# Patient Record
Sex: Male | Born: 1935 | Race: White | Hispanic: No | Marital: Married
Health system: Midwestern US, Academic
[De-identification: ages and names within clinical notes are randomized; demographics above are authoritative.]

---

## 2017-05-16 IMAGING — CR CHEST
1 series · 1 of 1 positions shown · non-contrast
Comparison: none

[chest port x-wise]
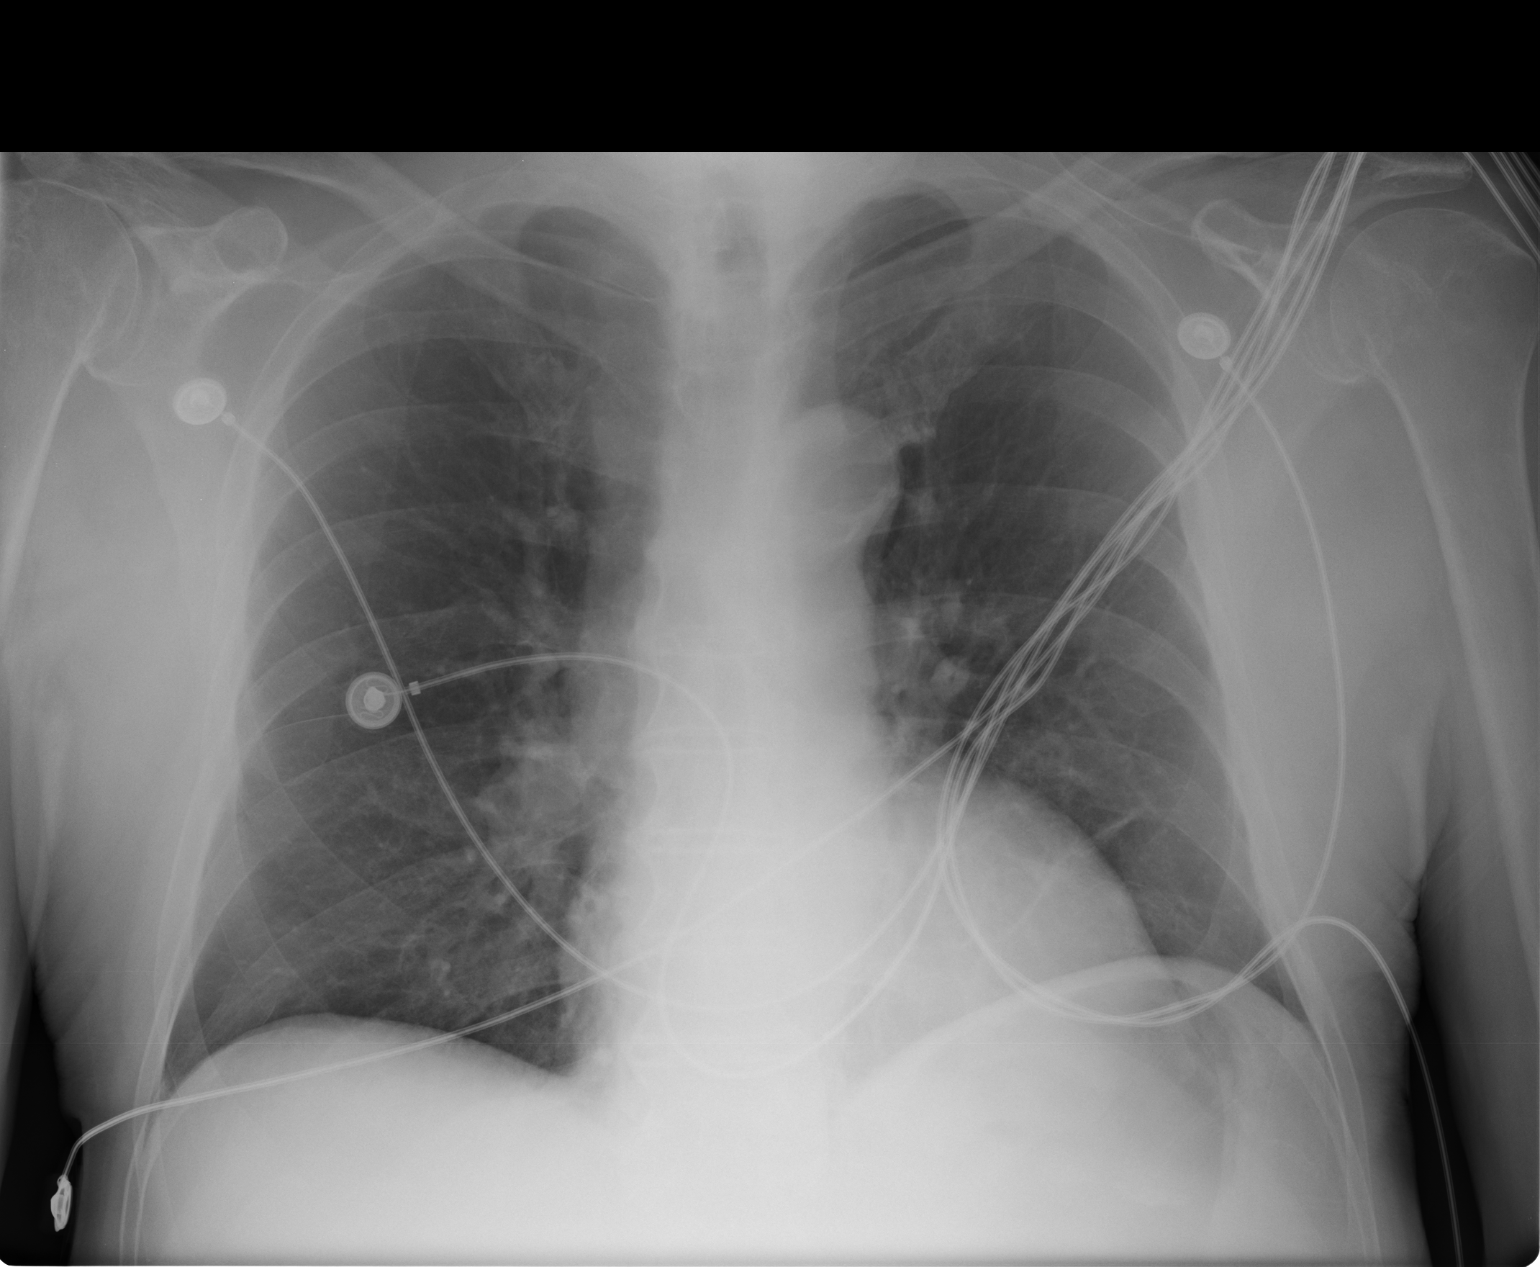

[1 of 1 positions shown; findings below may reference images not displayed]

DIAGNOSTIC STUDIES

EXAM

Chest radiograph.

INDICATION

decreased LOC
PT UNREPSONSIVE AND HYPOGLYCEMIC. UNABLE TO SPEAK. RESPONDS TO COMMANDS.
DENIES CHEST COMPLAINTS. HTN. HB

TECHNIQUE

AP chest.

COMPARISONS

None.

FINDINGS

There is mild pulmonary hyperinflation. Slight elevation of the left hemidiaphragm. No dense
consolidation. No pleural effusion or pneumothorax is evident. No cardiomegaly. Aortic
atherosclerosis. Acromioclavicular osteoarthropathy.

IMPRESSION

No compelling evidence of acute cardiopulmonary pathology.

## 2017-05-16 IMAGING — CT Head^_WITHOUT_CONTRAST (Adult)
1 of 2 series · 15 of 30 positions shown, 19 images · non-contrast
Comparison: none

[Series 3: brain w/o 4.8 brain · axial · non-contrast · 0.43mm/px · z∈[+132,+293]mm · 15 of 33 slices shown, 19 images]
[im 2/33  brain]
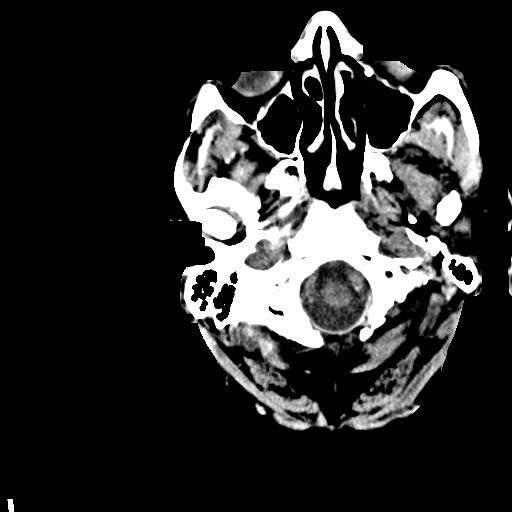
[im 2/33  bone]
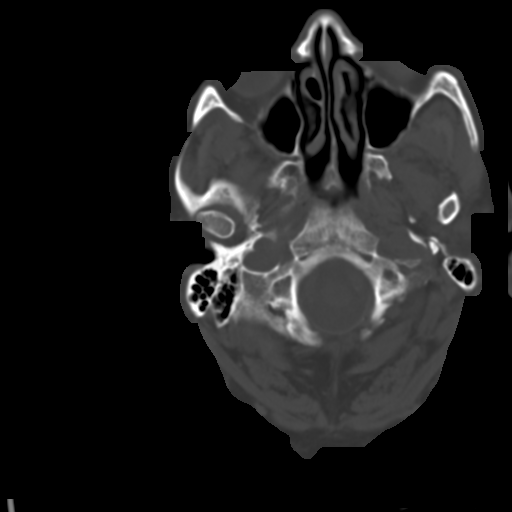
[im 5/33  brain]
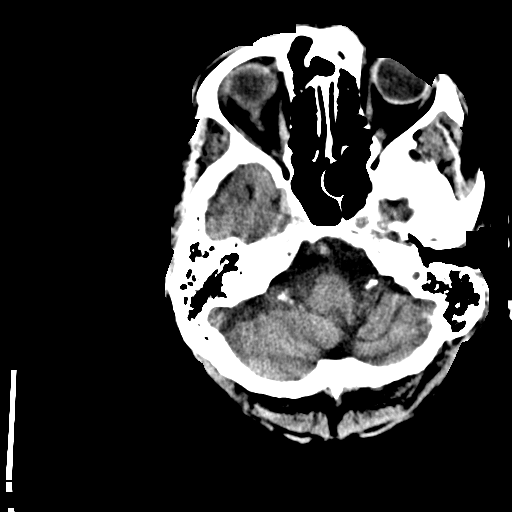
[im 7/33  brain]
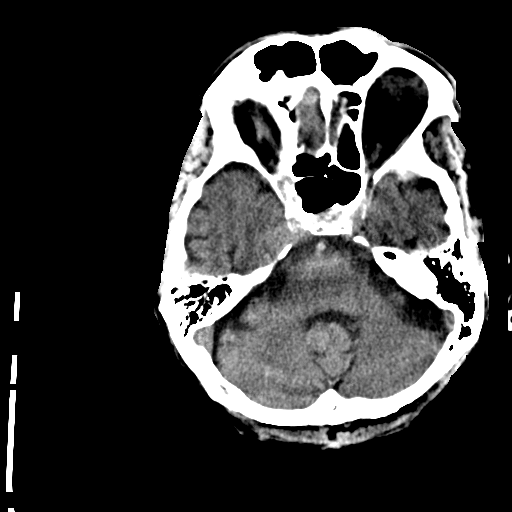
[im 8/33  brain]
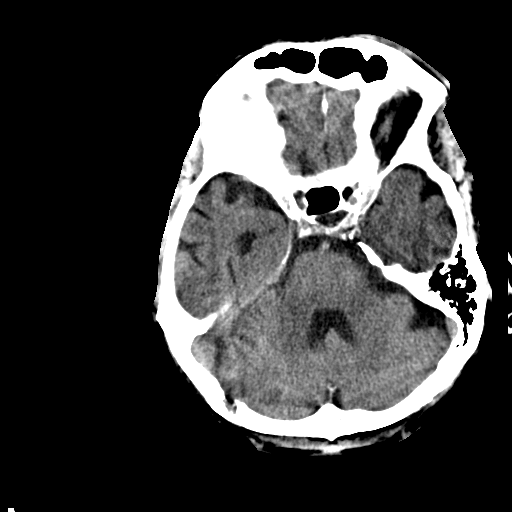
[im 11/33  brain]
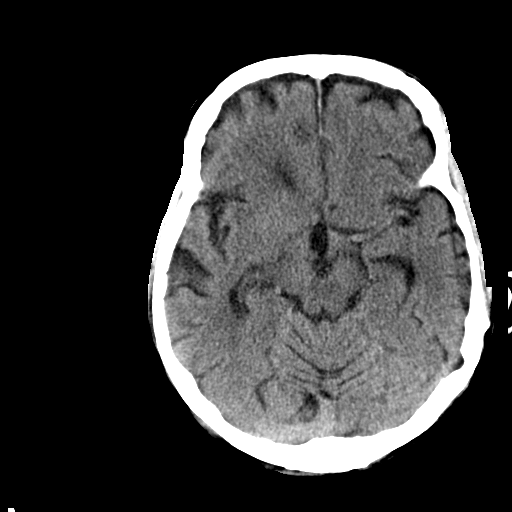
[im 11/33  bone]
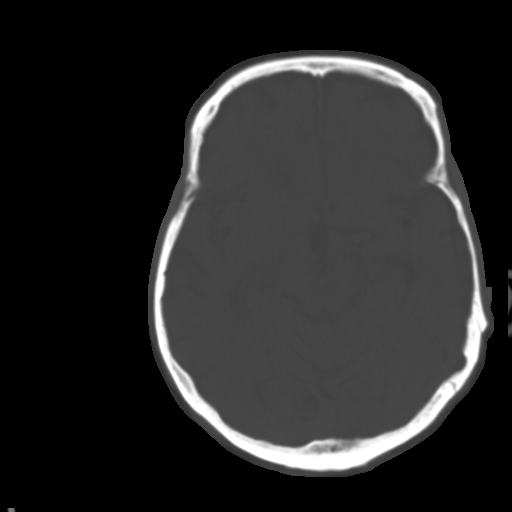
[im 13/33  brain]
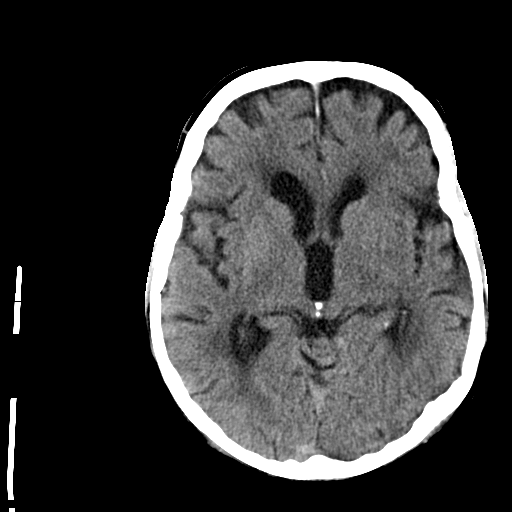
[im 14/33  brain]
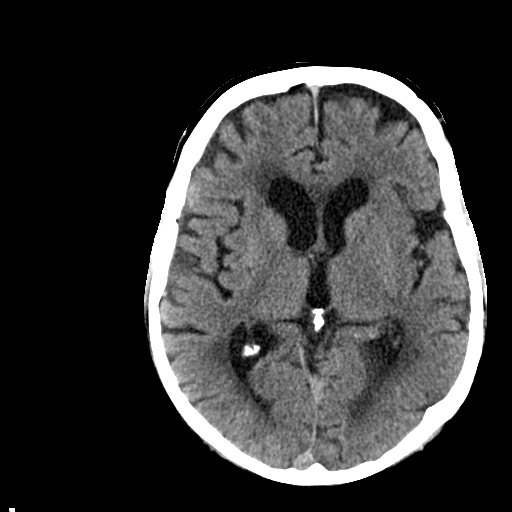
[im 17/33  brain]
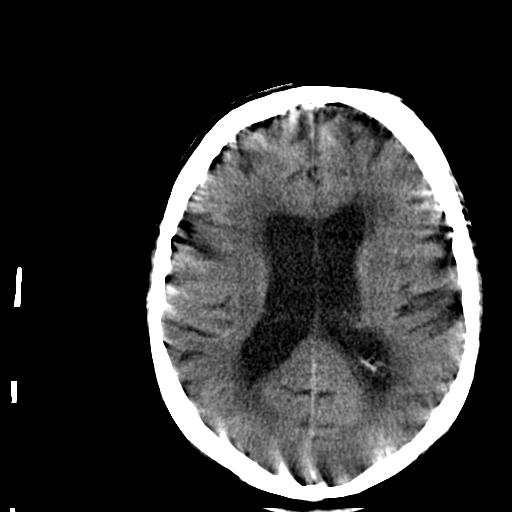
[im 19/33  brain]
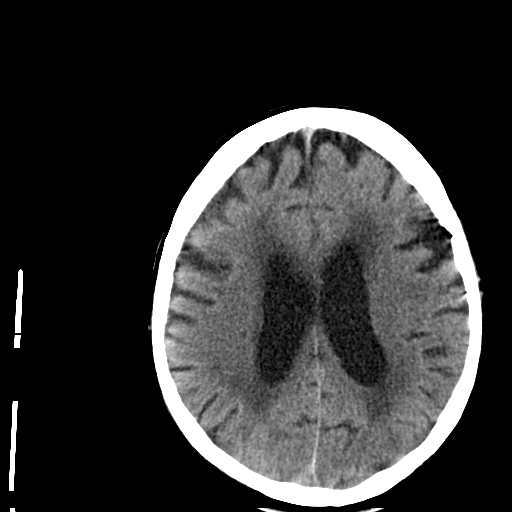
[im 19/33  bone]
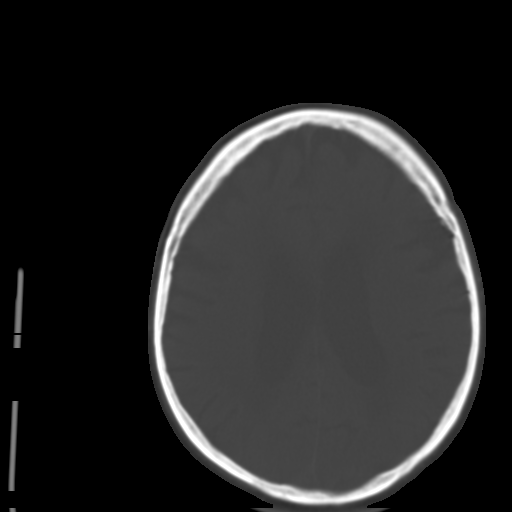
[im 20/33  brain]
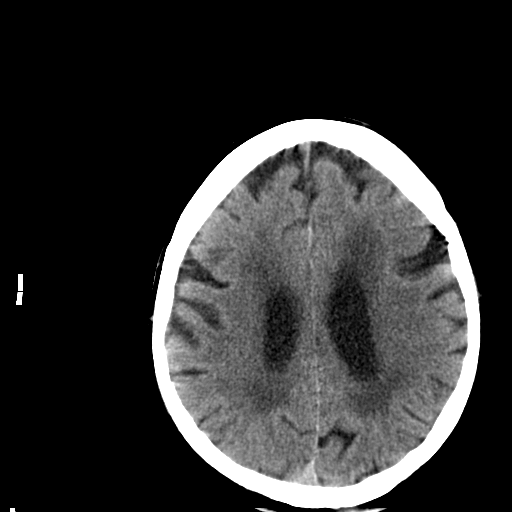
[im 23/33  brain]
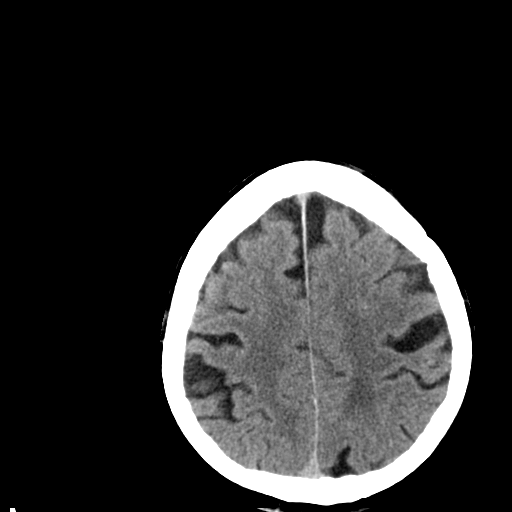
[im 25/33  brain]
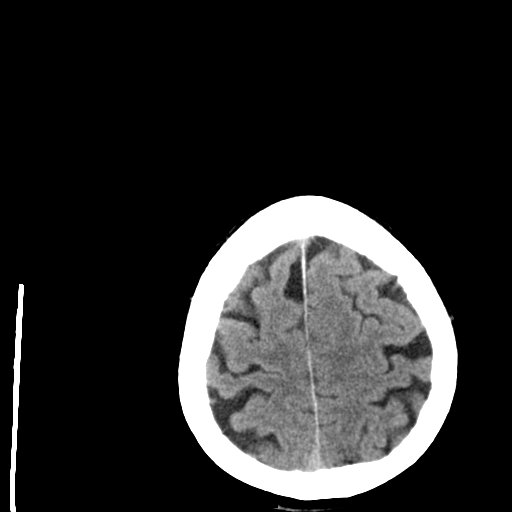
[im 26/33  brain]
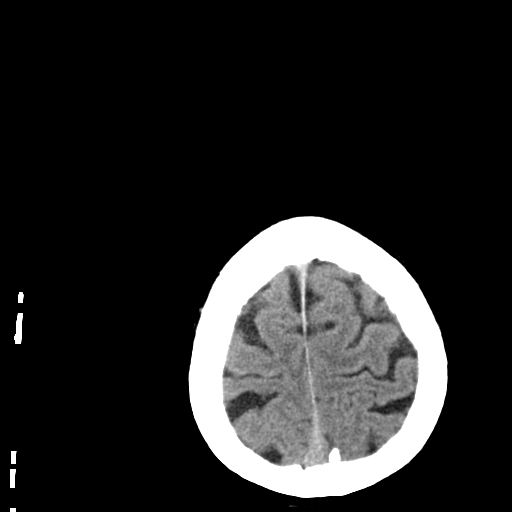
[im 26/33  bone]
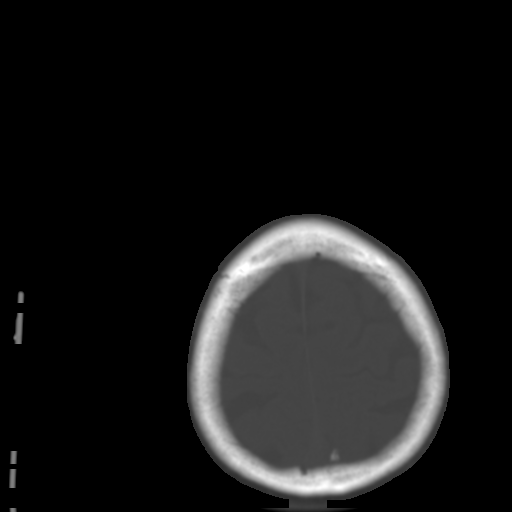
[im 29/33  brain]
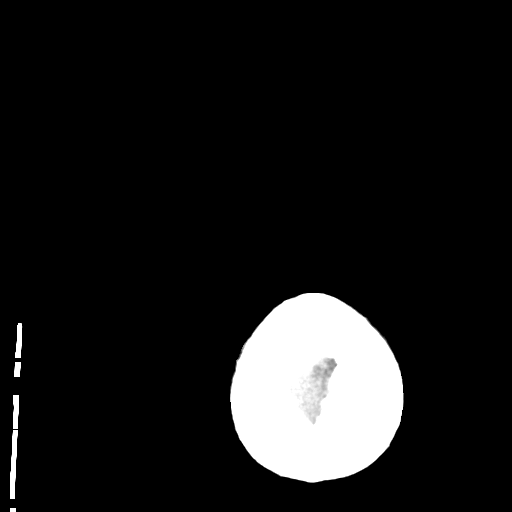
[im 31/33  brain]
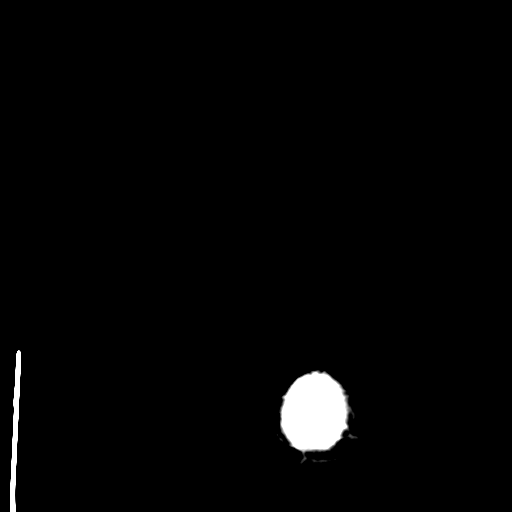

[15 of 30 positions shown; findings below may reference images not displayed]

EXAM

COMPUTED TOMOGRAPHY, HEAD OR BRAIN; WITHOUT CONTRAST MATERIAL; CPT 98398

INDICATION

left side weak acute
UNRESPONSIVE AT HOME. DIAPHORESIS. LT SIDE WEAKNESS. UNABLE TO SPEAK,
RESPONDS TO SIMPLE COMMANDS. HYPOGLYCEMIC-BLOOD SUGAR 28 WHEN FOUND. HB

TECHNIQUE

Noncontrast head CT was performed. All CT scans at this facility use dose modulation, iterative
reconstruction, and/or weight based dosing when appropriate to reduce radiation dose to as low as
reasonably achievable.

COMPARISONS

No prior studies are available for comparison.

FINDINGS

No evidence for acute infarct, mass, hemorrhage, or midline shift. No extra-axial fluid
collections. Moderate to severe chronic small vessel ischemic and involutional changes for age. Gray
/white matter differentiation is otherwise preserved. Lateral ventricles are symmetric and
proportional with other CSF spaces. Bilateral cataract surgeries.

Review of bone and soft tissue windows is unremarkable.

IMPRESSION

1. No acute intracranial abnormality.

2. Chronic small vessel ischemic and involutional changes for age.

Findings were discussed with Dr. Iliyas at [DATE] p.m. on date of exam.

## 2019-03-31 IMAGING — CR PELVIS
1 series · 1 of 1 positions shown · non-contrast
Comparison: none

[l-spine lat]
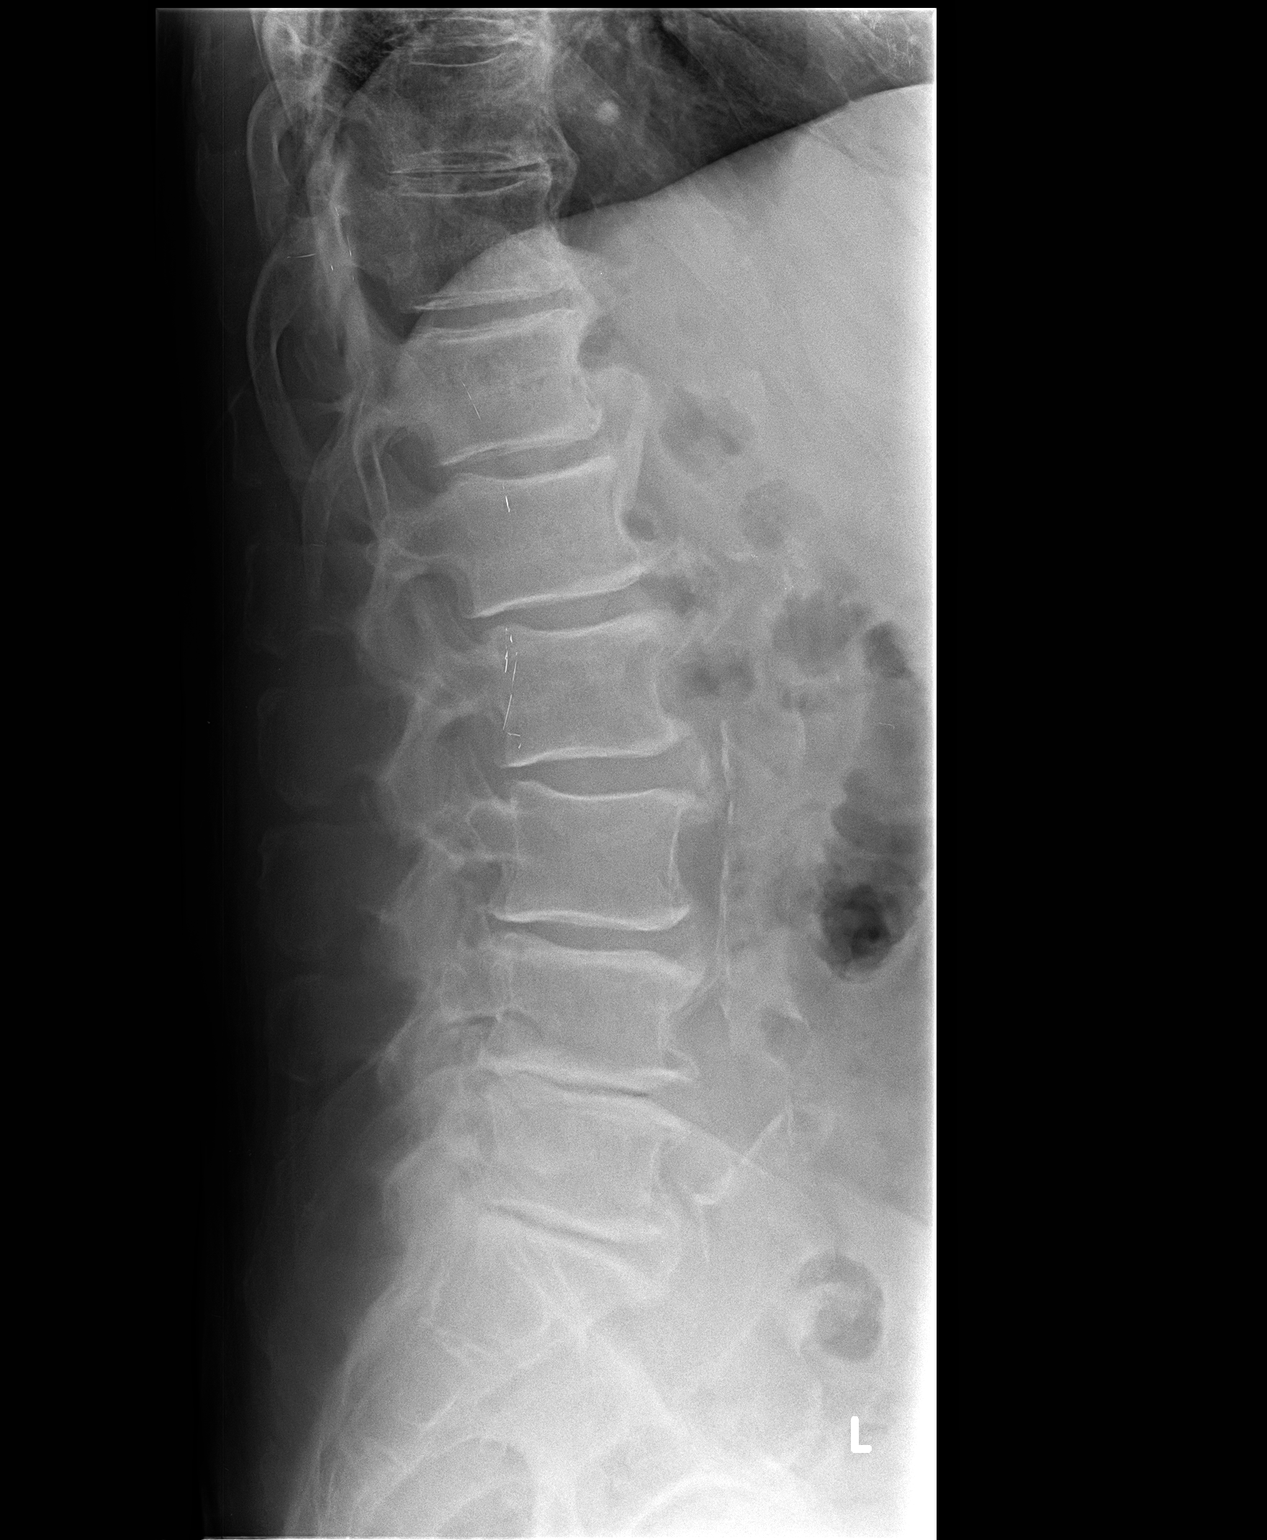

[1 of 1 positions shown; findings below may reference images not displayed]

DIAGNOSTIC STUDIES

EXAM

THREE VIEW LUMBAR SPINE

INDICATION

gait instability, radiculopathy
pt c/o left hip pain x2 weeks, no specific injury. denies sx hx. Bm/AK

TECHNIQUE

AP, lateral, and coned down lateral lumbar spine views

COMPARISONS

None

FINDINGS

No anterolisthesis is identified. No findings to suggest acute bony injury/fracture are present.
Multilevel degenerative disc disease is present greatest at L4-5 and L5-S1. Spondylitic changes are
present with marginal vertebral body osteophyte formation. Bony structures are otherwise
unremarkable.

The psoas margins are  sharp.  The bowel gas pattern is unremarkable.   Atherosclerotic vascular
calcification is present. Basilar granuloma is noted.

IMPRESSION

Limited lumbar spine demonstrates no acute injury. Spondylitic changes.

Tech Notes:

pt c/o left hip pain x2 weeks, no specific injury. denies sx hx. Bm/AK

## 2020-03-09 IMAGING — CR HUMERLT
2 series · 2 of 2 positions shown · non-contrast
Comparison: none

[humerus ap]
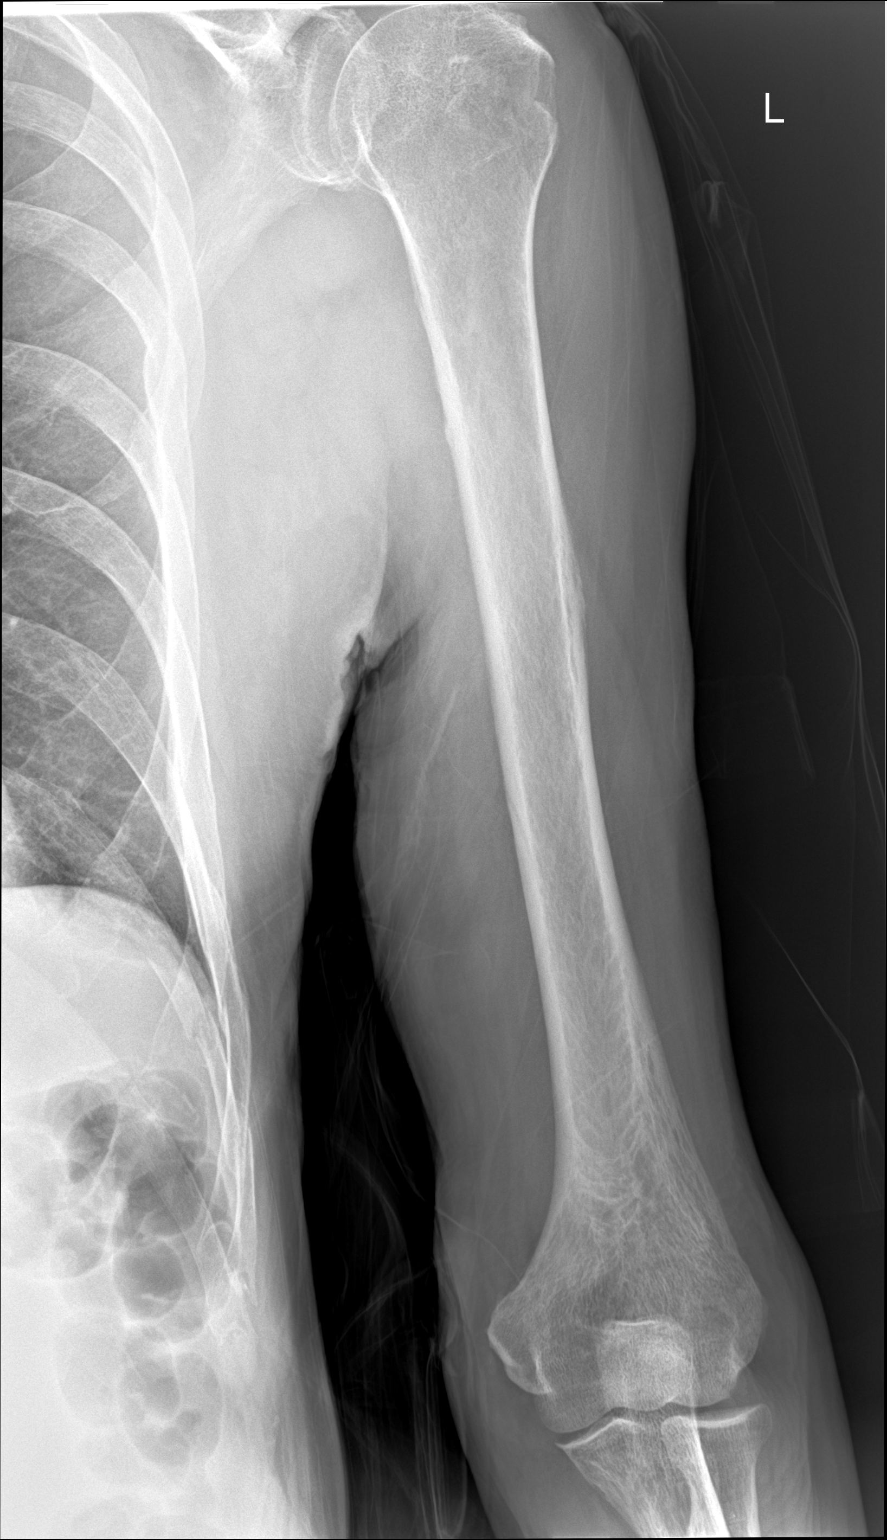

[humerus lat]
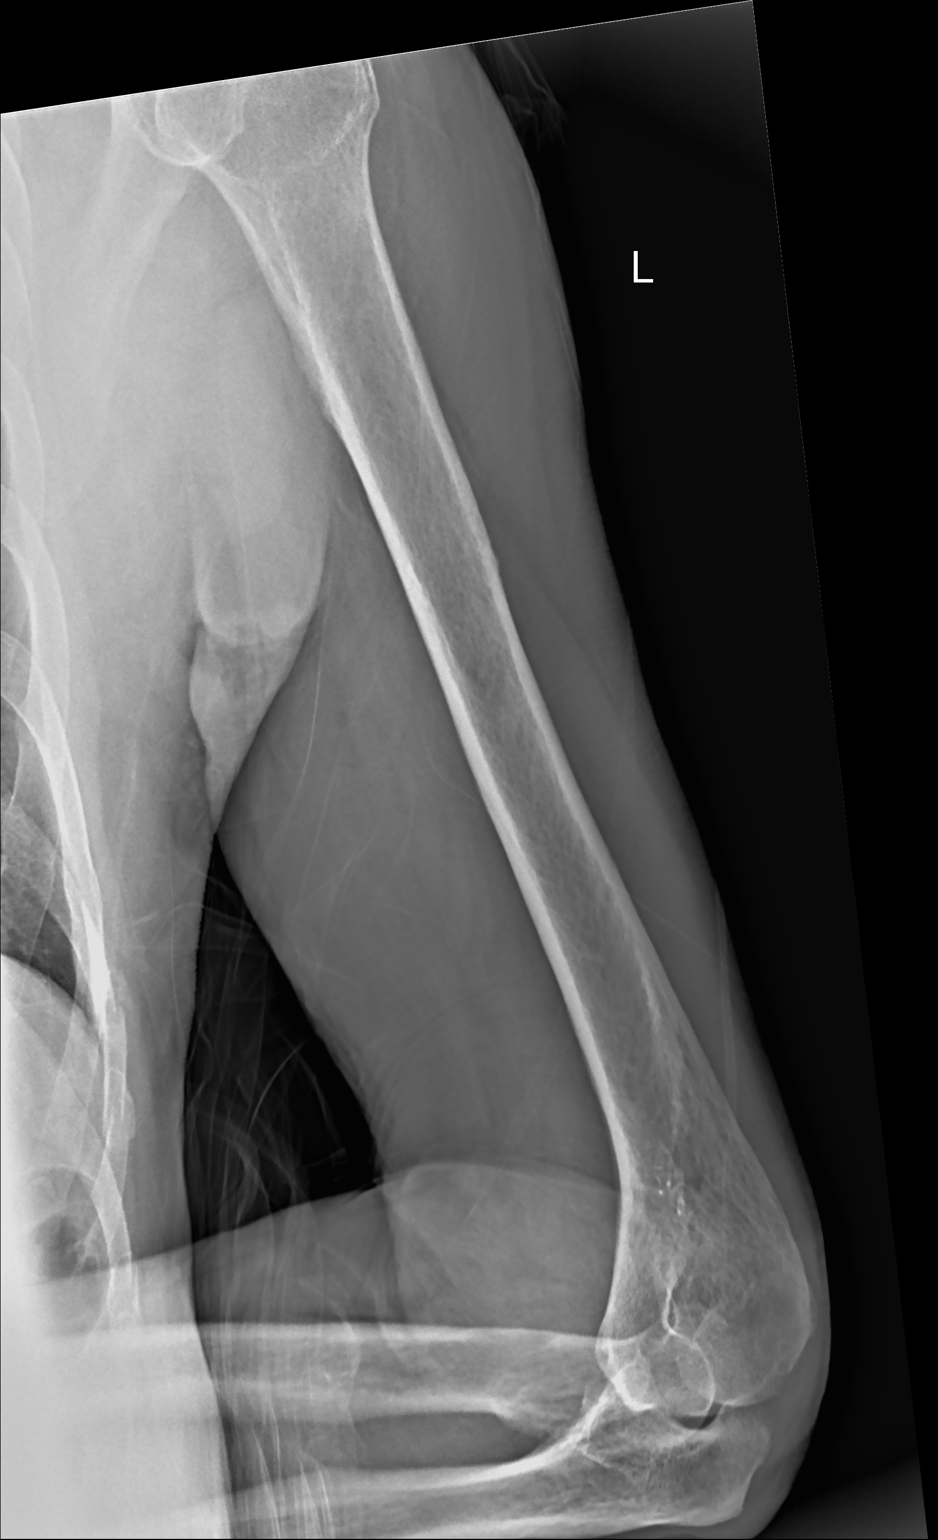

[2 of 2 positions shown; findings below may reference images not displayed]

EXAM

XR left shoulder XR left humerus

INDICATION

Status post fall

TECHNIQUE

Two views of the left humerus. Four views of the left shoulder.

COMPARISONS

None available at the time of dictation.

FINDINGS

No radiographic evidence of an acute fracture, osseous malalignment, or aggressive focal osseous
lesion. There is anatomic joint alignment. Mild degenerative change of the left glenohumeral joint.
Spurring at the inferior aspect of the lateral acromion. Suspected fluffy calcification measuring up
to 1.6 cm best appreciated on axillary Y-view likely overlying the middle facet of the greater
tuberosity at the infra spinatus tendon insertion which likely represents calcific tendinitis
(hydroxyapatite deposition). Prior fracture which is healed at of the left lateral 5th rib and left
posterior 4th rib.

IMPRESSION
1. Left shoulder calcific tendinitis and degenerative changes detailed above.
2. Prior healed rib fractures.

Tech Notes:

Patient c/o left humerus and left shoulder pain. Patient states he fell last night, 03/09/19, and has
been having pain since. BM/CF

## 2020-03-09 IMAGING — CR SHOULDCMLT
4 series · 4 of 4 positions shown · non-contrast
Comparison: none

[shoulder ap]
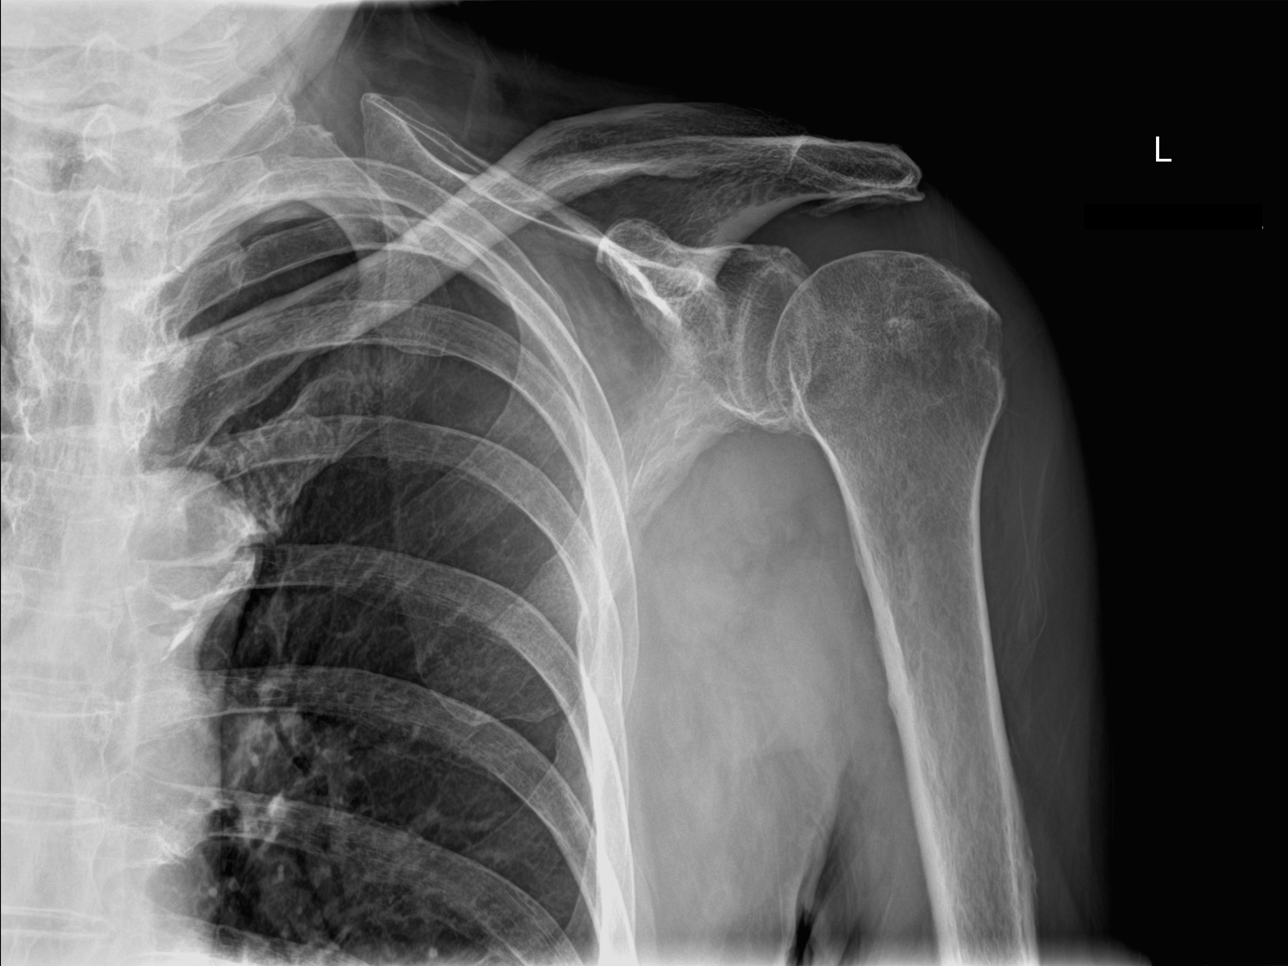

[shoulder axial clavicle]
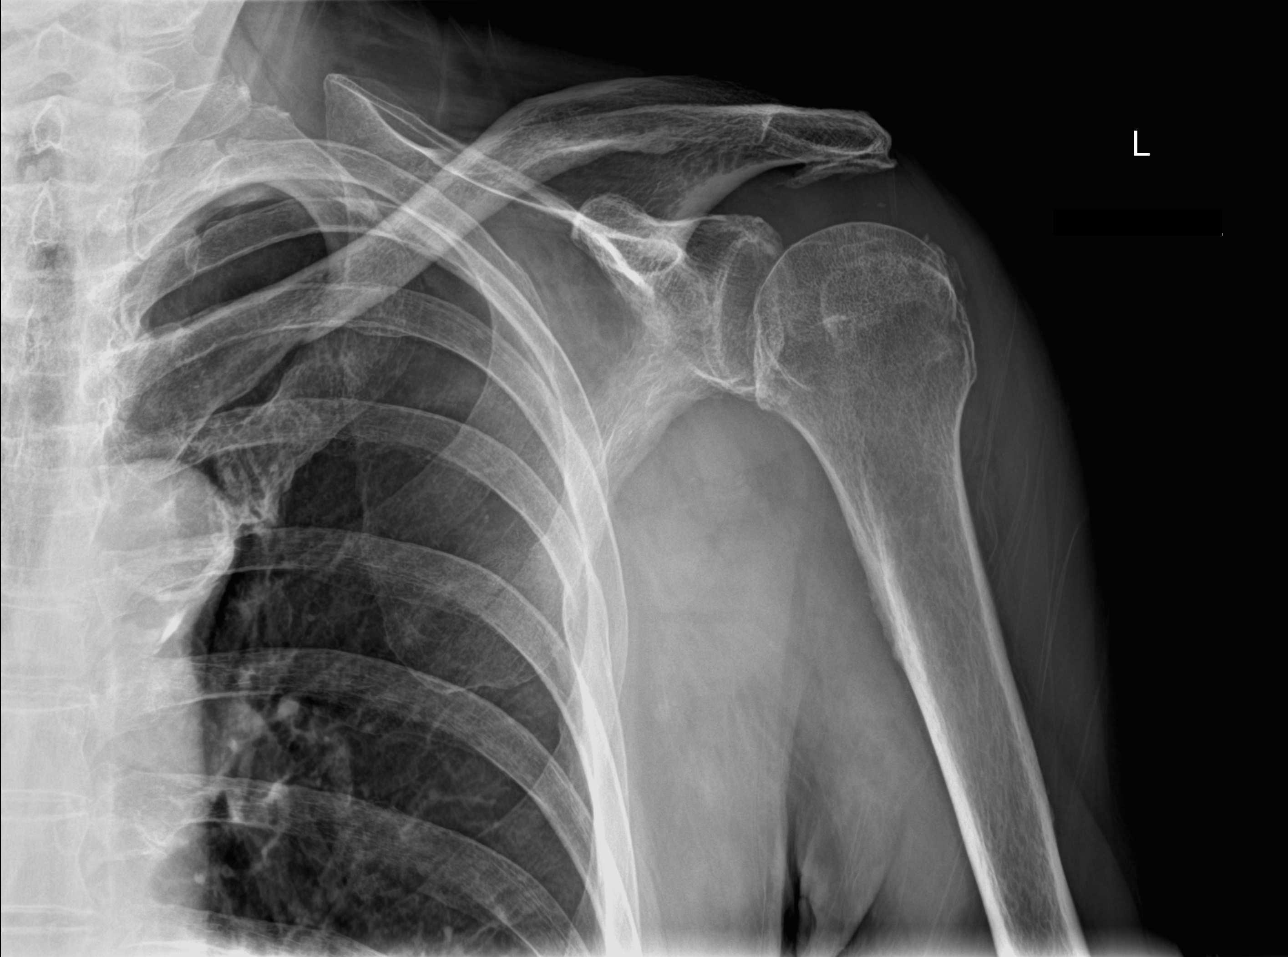

[shoulder y-view]
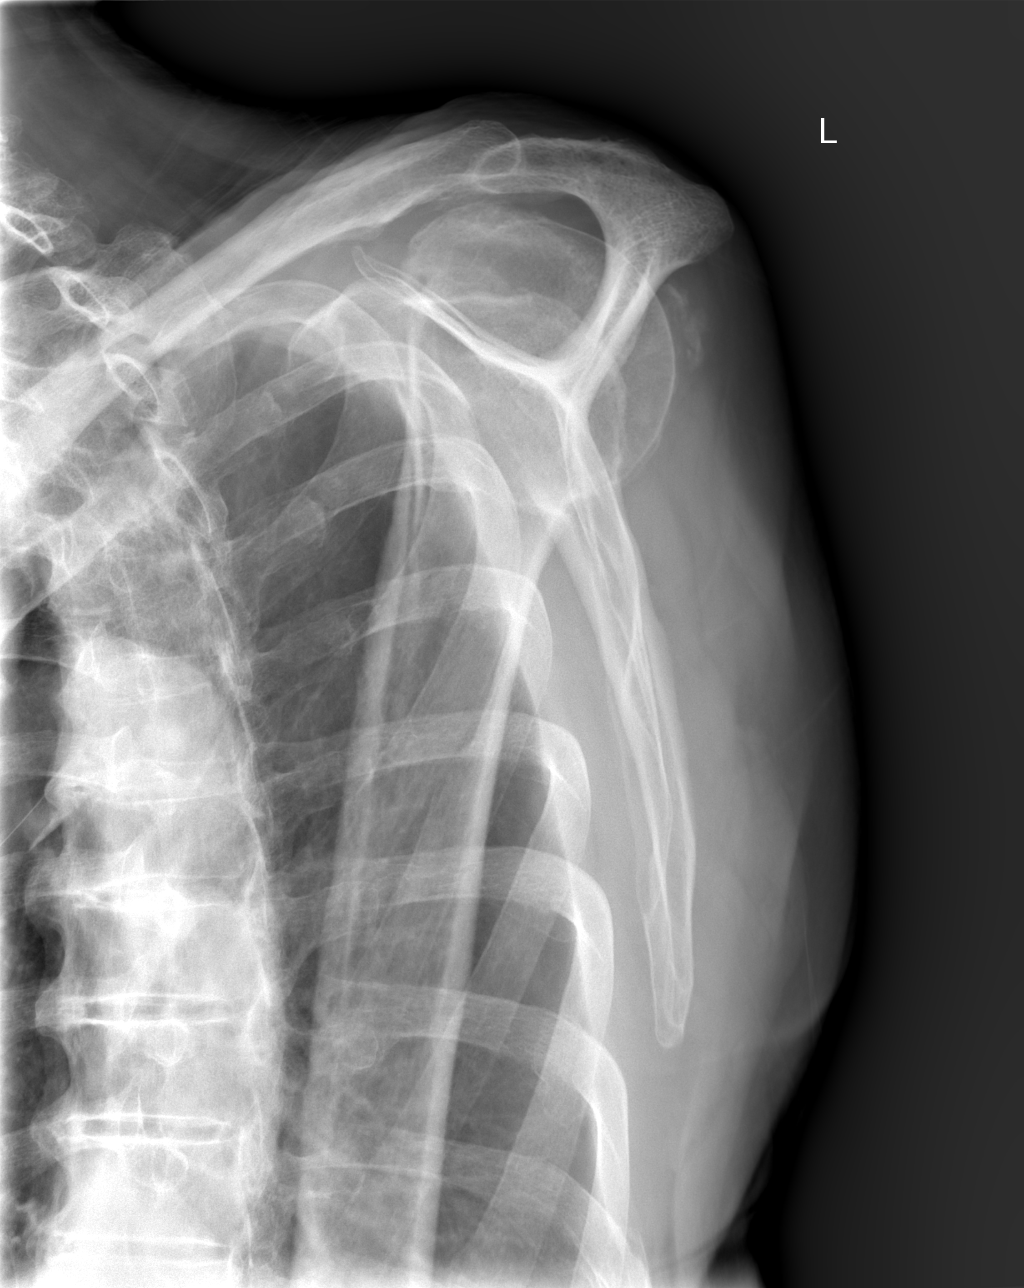

[shoulder axial lat sitting]
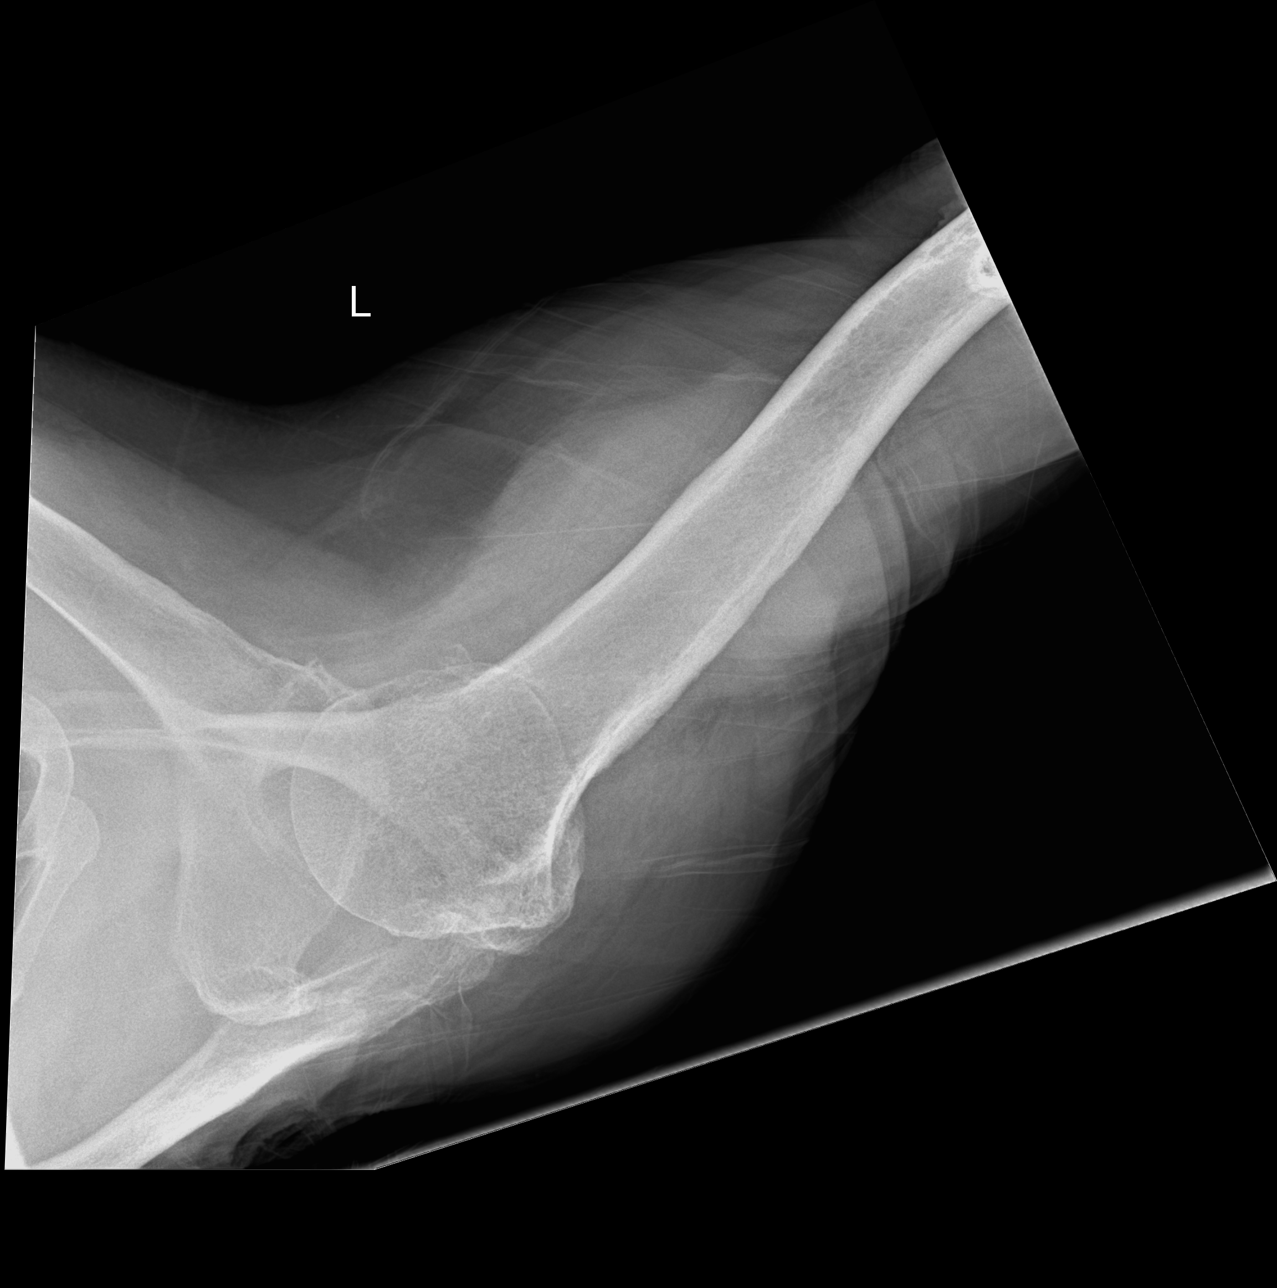

[4 of 4 positions shown; findings below may reference images not displayed]

EXAM

XR left shoulder XR left humerus

INDICATION

Status post fall

TECHNIQUE

Two views of the left humerus. Four views of the left shoulder.

COMPARISONS

None available at the time of dictation.

FINDINGS

No radiographic evidence of an acute fracture, osseous malalignment, or aggressive focal osseous
lesion. There is anatomic joint alignment. Mild degenerative change of the left glenohumeral joint.
Spurring at the inferior aspect of the lateral acromion. Suspected fluffy calcification measuring up
to 1.6 cm best appreciated on axillary Y-view likely overlying the middle facet of the greater
tuberosity at the infra spinatus tendon insertion which likely represents calcific tendinitis
(hydroxyapatite deposition). Prior fracture which is healed at of the left lateral 5th rib and left
posterior 4th rib.

IMPRESSION
1. Left shoulder calcific tendinitis and degenerative changes detailed above.
2. Prior healed rib fractures.

Tech Notes:

Patient c/o left humerus and left shoulder pain. Patient states he fell last night, 03/09/19, and has
been having pain since. BM/CF

## 2020-03-25 IMAGING — MR Shoulder^ROUTINE
5 of 7 series · 25 of 40 positions shown · non-contrast
Comparison: none

[Series 5: T1 · oblique · 4.0mm · 0.44mm/px · 7 of 22 slices shown (1 of 2)]
[im 1/22]
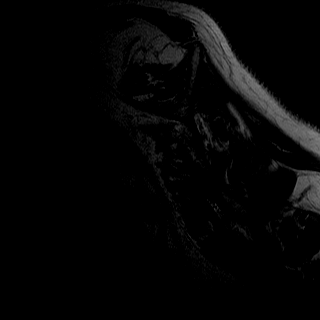
[im 4/22]
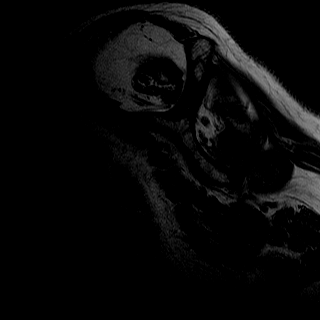
[im 8/22]
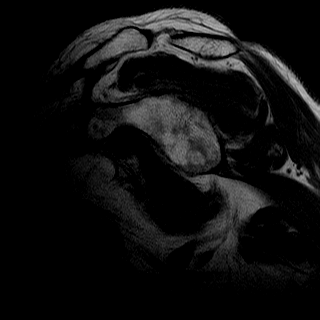
[im 11/22]
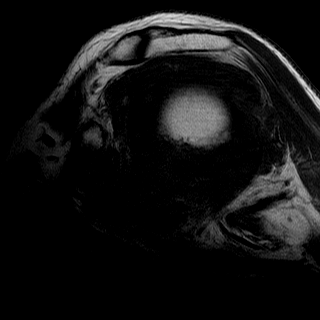
[im 15/22]
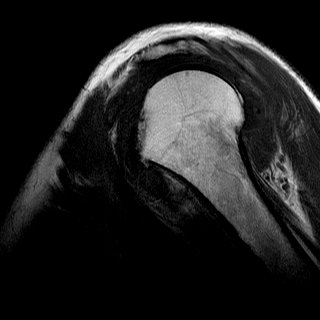
[im 18/22]
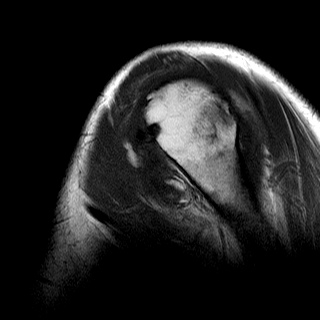
[im 22/22]
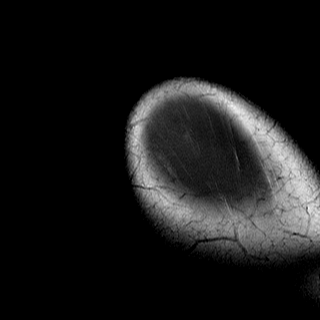

[Series 6: STIR · oblique · 4.0mm · 0.27mm/px · 1 of 22 slices shown]
[im 1/22]
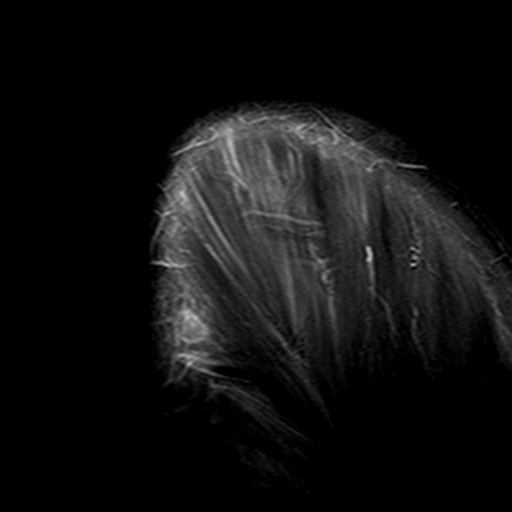

[Series 9: T2 fat-sat · axial · 4.0mm · 0.35mm/px · z∈[-42,+63]mm · 6 of 23 slices shown (1 of 2)]
[im 1/23]
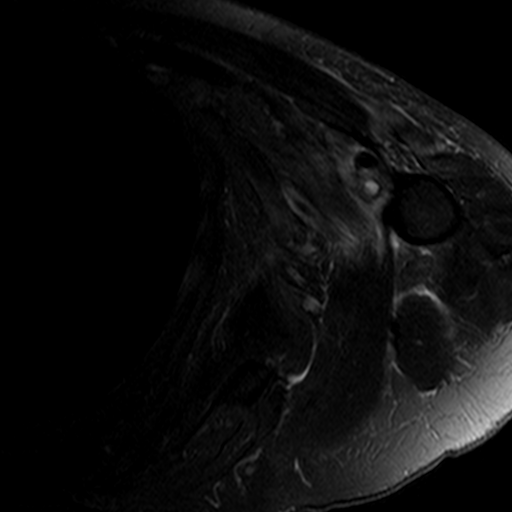
[im 5/23]
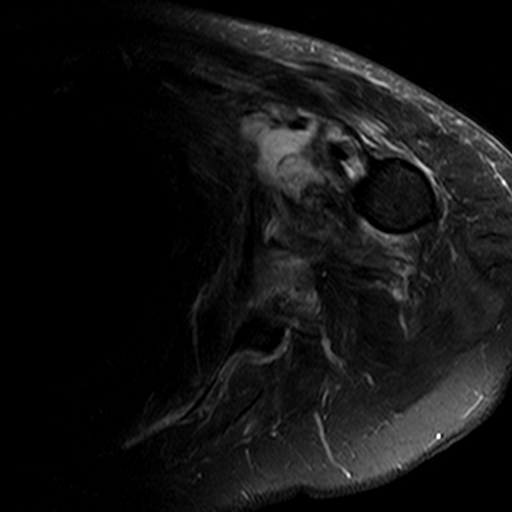
[im 9/23]
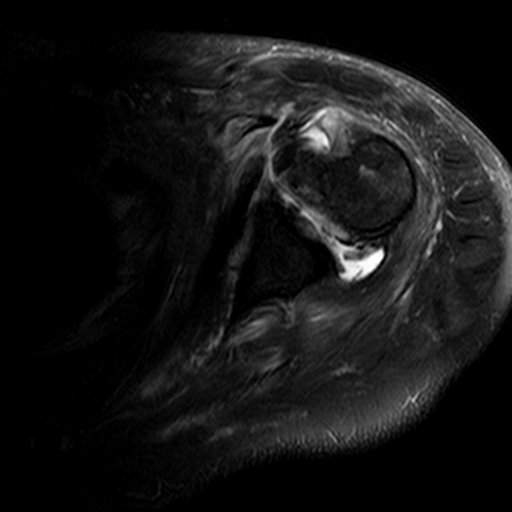
[im 14/23]
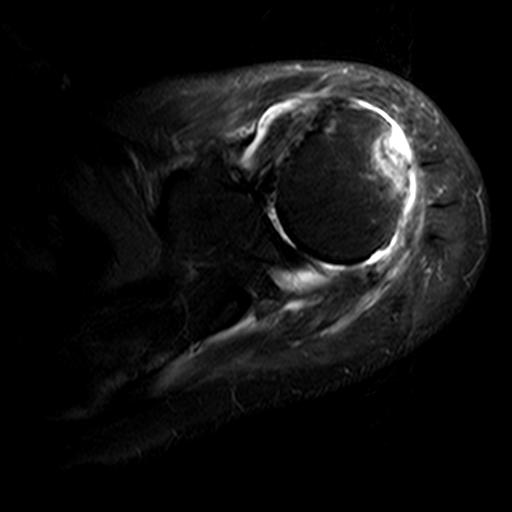
[im 18/23]
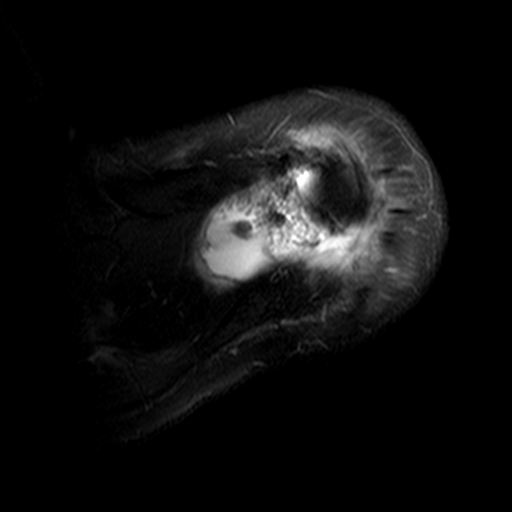
[im 23/23]
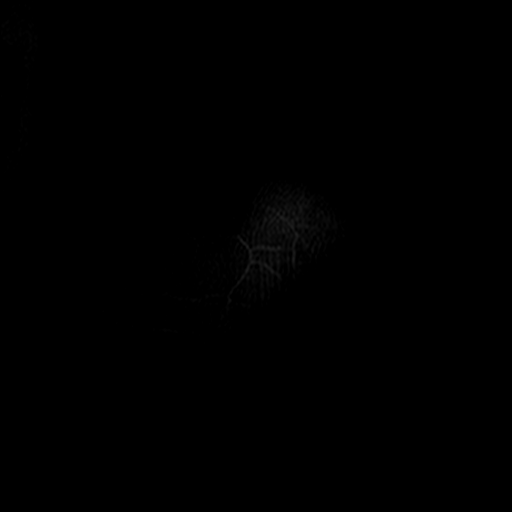

[Series 10: T2 fat-sat · oblique · 4.0mm · 0.27mm/px · 5 of 18 slices shown (2 of 2)]
[im 1/18]
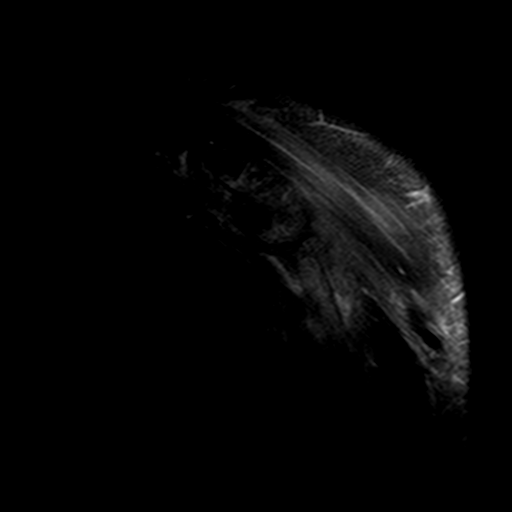
[im 5/18]
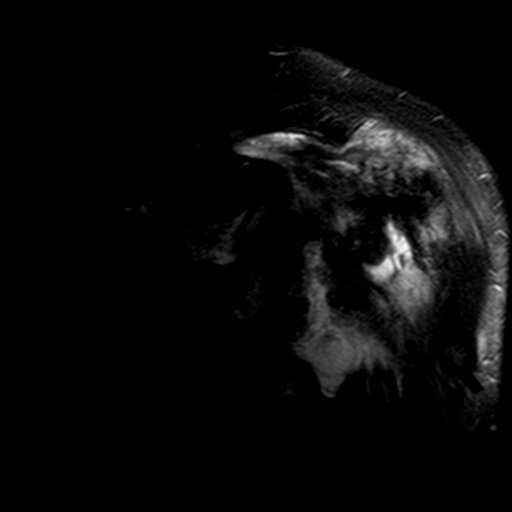
[im 9/18]
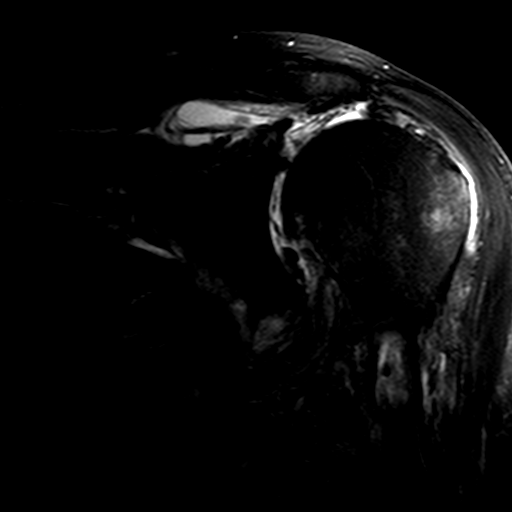
[im 13/18]
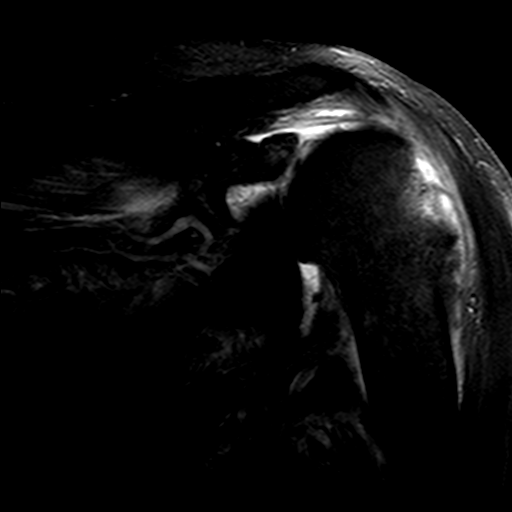
[im 18/18]
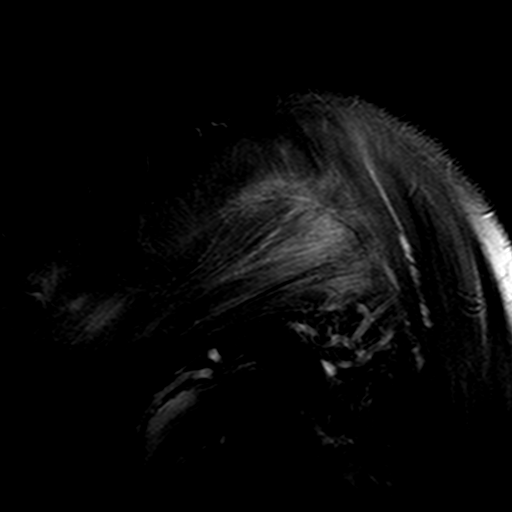

[Series 11: T1 · oblique · 4.0mm · 0.44mm/px · 6 of 22 slices shown (2 of 2)]
[im 1/22]
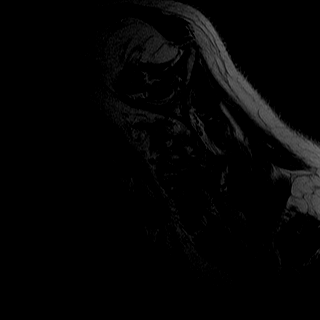
[im 5/22]
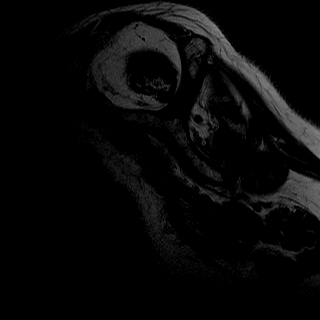
[im 9/22]
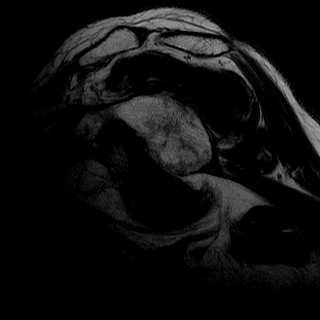
[im 13/22]
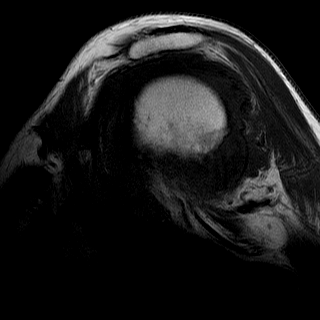
[im 17/22]
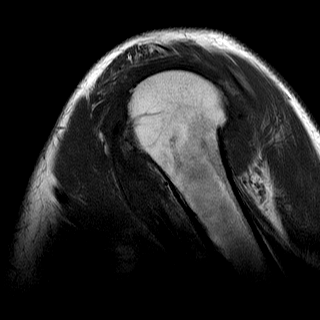
[im 22/22]
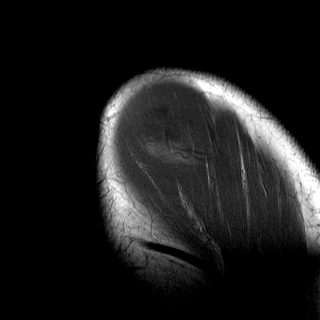

[25 of 40 positions shown; findings below may reference images not displayed]

DIAGNOSTIC STUDIES

EXAM

Left shoulder MRI.

INDICATION

fall, pain, immobility of LEFT Shoulder
FALL, CONTINUED PAIN TO LEFT LATERAL SHOULDER WITH LIMITED ROM.  RG

TECHNIQUE

Noncontrast left shoulder MRI protocol.

COMPARISONS

March 09, 2020

FINDINGS

Acromioclavicular joint: There is minimal degenerative change of the acromioclavicular joint,
though there is undersurface spurring of the distal acromion. Type 2 acromion. No thickening of the
coracoacromial ligament. The coracoclavicular ligaments are intact.

Rotator cuff: There is full thickness tearing of the supraspinatus tendon with high-riding humeral
head. The tear propagates into the infraspinatus tendon which is likewise completely torn and
retracted. The tendons are retracted to the level of the glenohumeral articulation with thickened
cable scar spanning the supraspinatus tendon with the infra spinatus and subscapularis tendons.
There is irregular tear propagation into the subscapularis tendon. There is mild fatty atrophy of
the supraspinatus, infraspinatus, and subscapularis muscles. The teres minor tendon is intact. Mild
edematous signal is noted in the teres minor and infraspinatus muscles.

Biceps tendon: The biceps tendon is dislocated medially from the intertubercular groove and is
thickened and mildly edematous compatible with tendinopathy. However, the bicipital anchor is
intact. Findings are compatible with tearing of the transverse humeral ligament.

Labrum: Diminutive residual labral tissue with irregular intra-articular bodies, most likely
reflecting labral fragments, particularly at the inferior glenohumeral articulation.

Marrow/joint space: Mild posterior translation of the humeral head relative to the glenoid. There
is moderate to severe cartilage thinning of the humeral head with thinning of the glenoid cartilage
also noted. The glenohumeral ligaments are not significantly thickened or edematous. Moderate
glenohumeral joint effusion with synovitis and intra-articular debris. Cystic change and edema at
the posterior greater tuberosity is also present.

IMPRESSION

Large full thickness rotator cuff tear with mild edema and fatty atrophy of the rotator cuff
musculature. The biceps tendon is dislocated medially and shows hypertrophic tendinopathy. Likely
degenerative tearing of the labrum with intra-articular bodies and marginal spurring/chondrosis of
the glenohumeral articular surfaces. Glenohumeral joint effusion is also noted.

Tech Notes:

FALL, CONTINUED PAIN TO LEFT LATERAL SHOULDER WITH LIMITED ROM.  RG

## 2020-05-02 IMAGING — CT BRAIN WO(Adult)
3 series · 15 of 46 positions shown, 18 images · non-contrast
Comparison: none

[Series 4: brain cor 5.00 hr40 s3 · coronal · 0.34mm/px · 3 of 9 slices shown]
[im 4/9  brain]
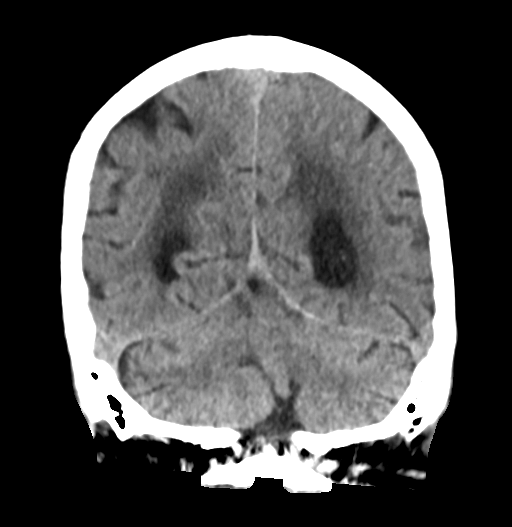
[im 5/9  brain]
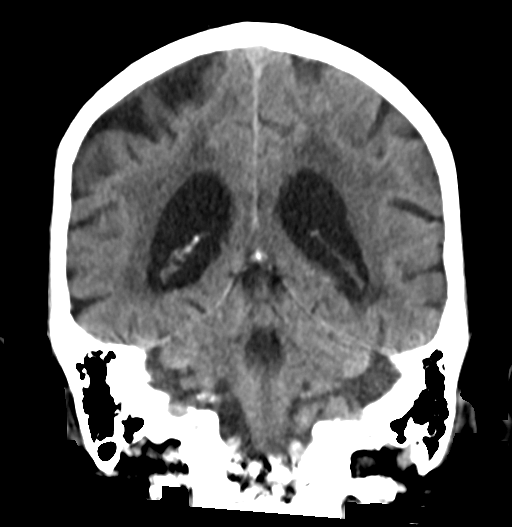
[im 6/9  brain]
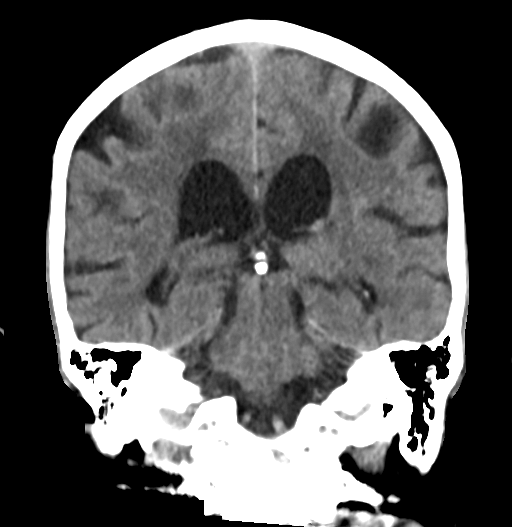

[Series 6: brain sag 5.00 hr40 s3 · sagittal · 0.35mm/px · 3 of 18 slices shown]
[im 6/18  brain]
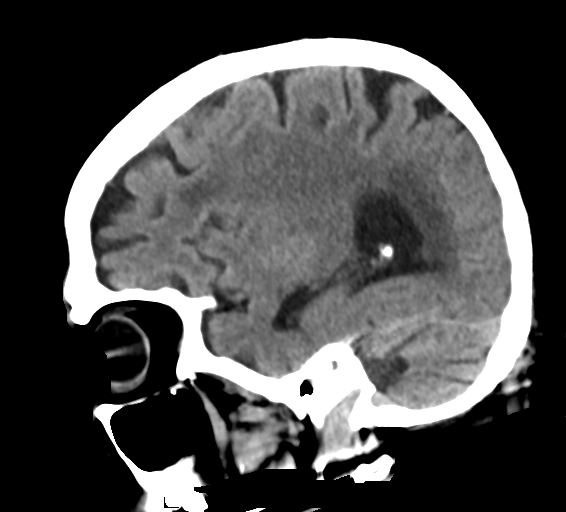
[im 9/18  brain]
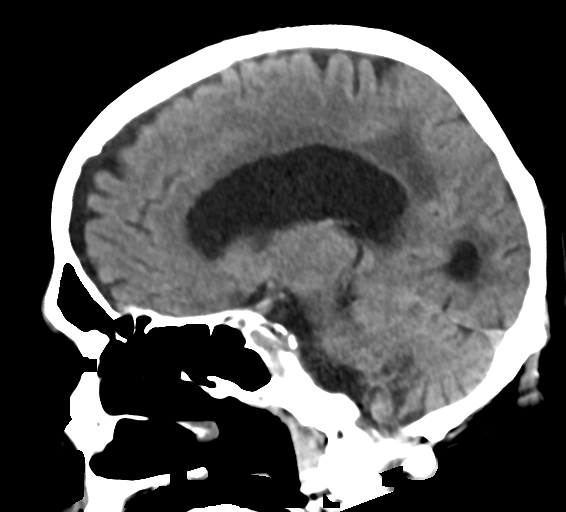
[im 12/18  brain]
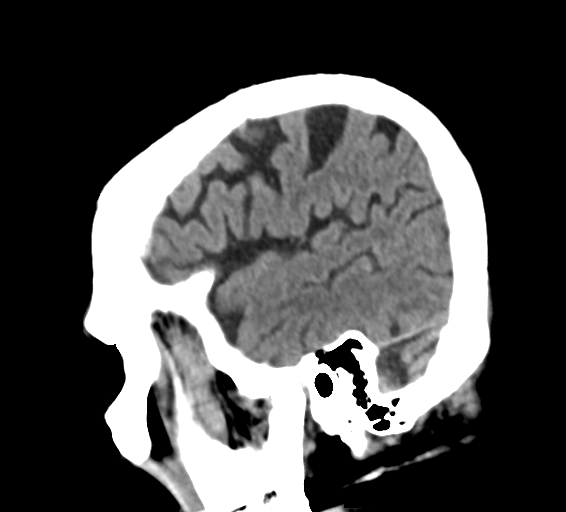

[Series 8: brain ax 2.00 hr60 s3 · axial · 0.34mm/px · z∈[-498,-376]mm · 9 of 70 slices shown, 12 images]
[im 5/70  brain]
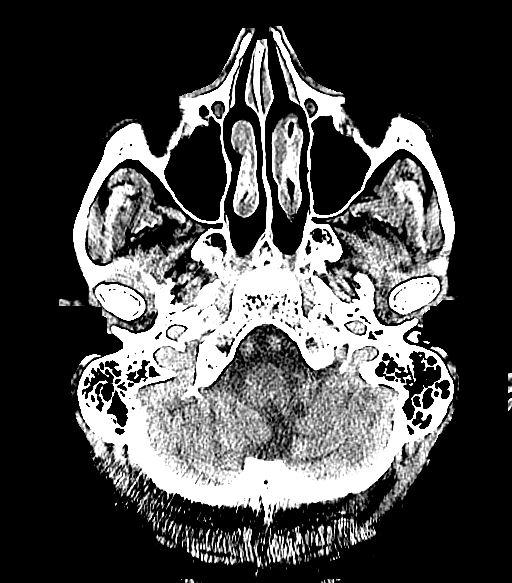
[im 5/70  bone]
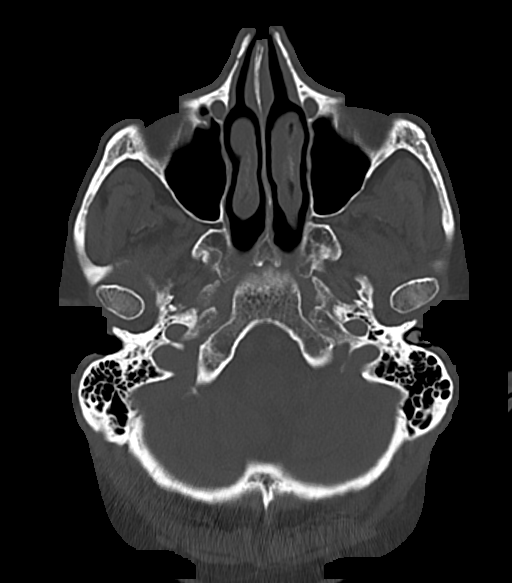
[im 12/70  brain]
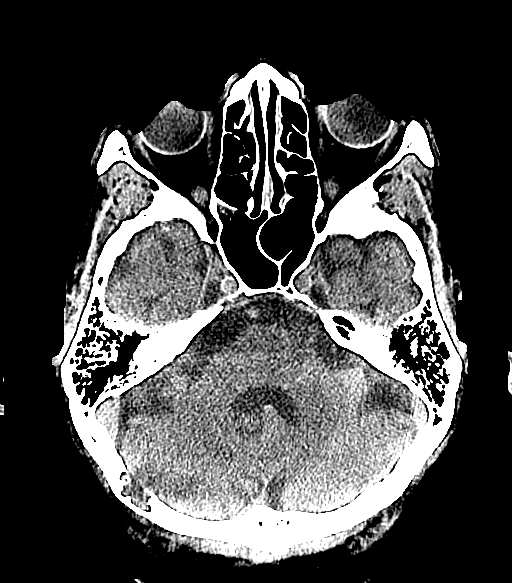
[im 20/70  brain]
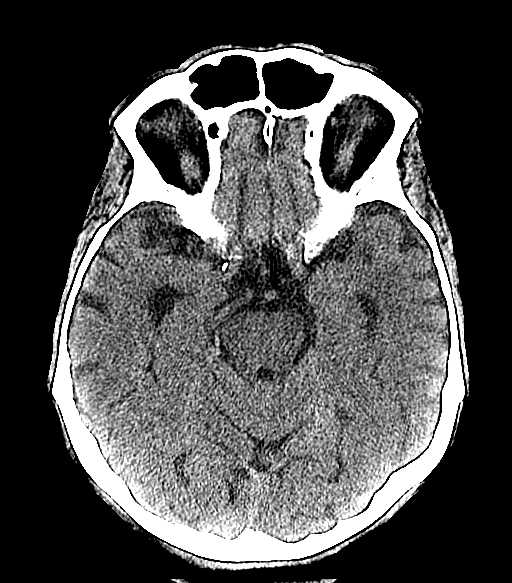
[im 27/70  brain]
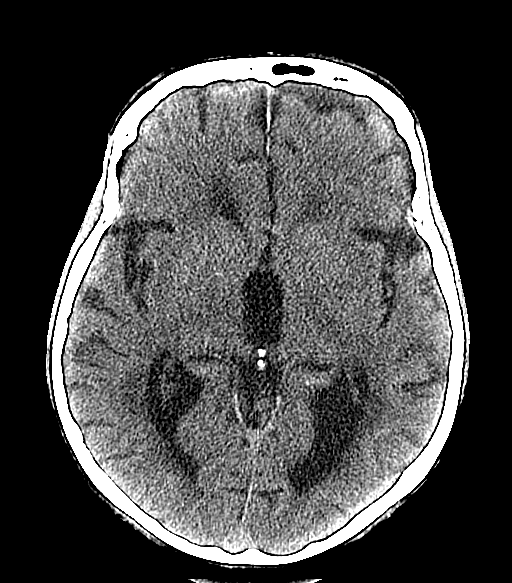
[im 36/70  brain]
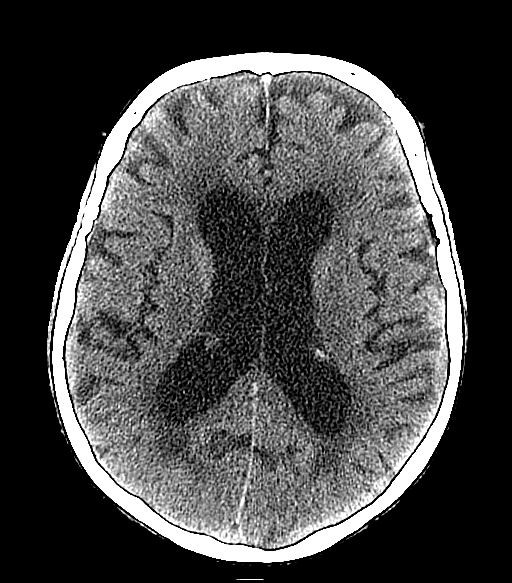
[im 36/70  bone]
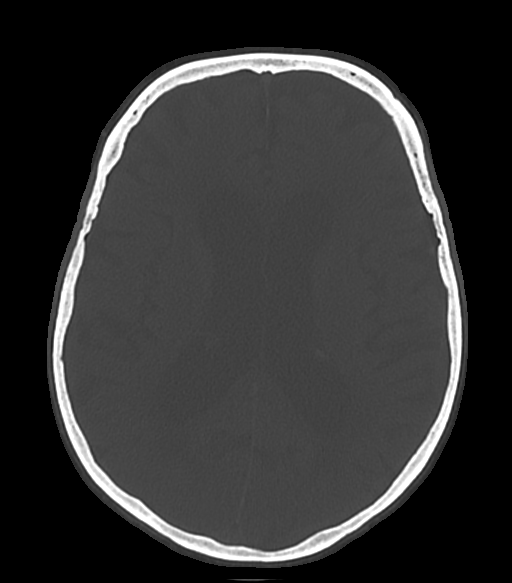
[im 43/70  brain]
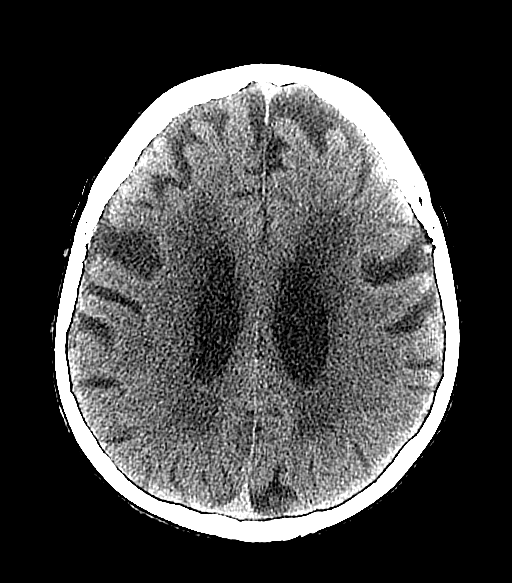
[im 50/70  brain]
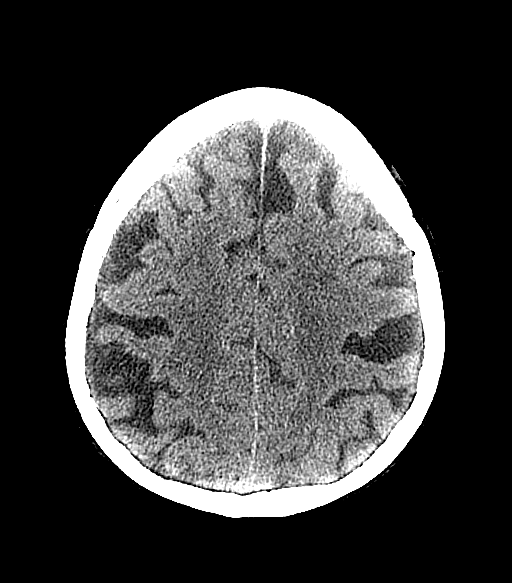
[im 58/70  brain]
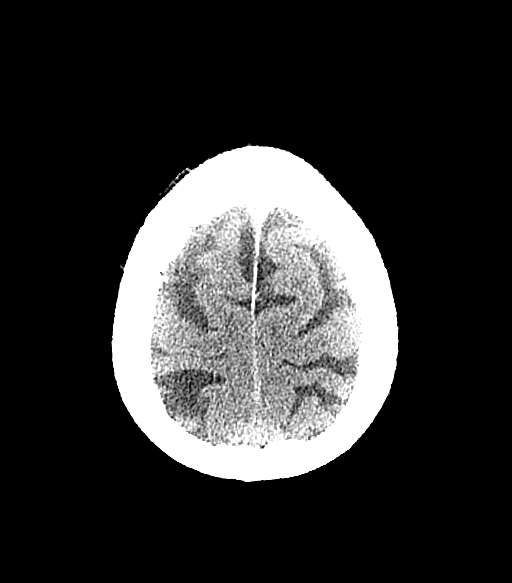
[im 65/70  brain]
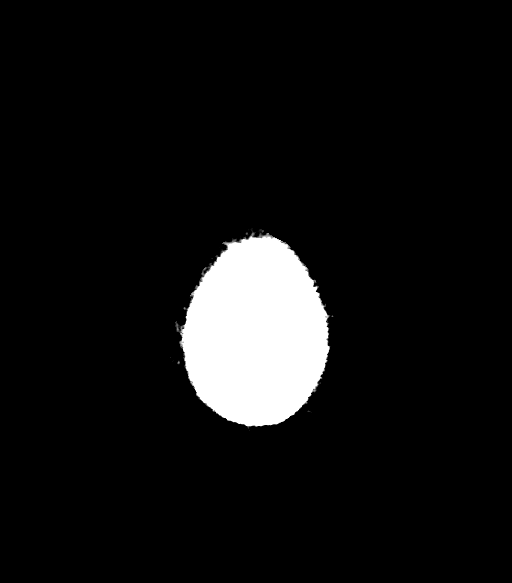
[im 65/70  bone]
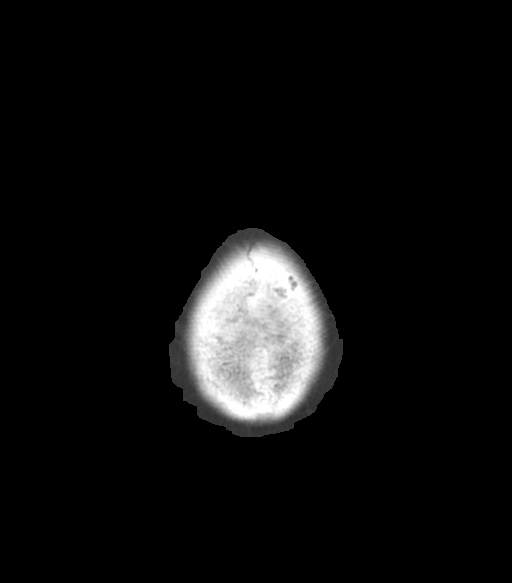

[15 of 46 positions shown; findings below may reference images not displayed]

DIAGNOSTIC STUDIES

EXAM

CT scan of the head without contrast.

INDICATION

sudden onset aphasia, altered mental status
CTDI 38.99. Sudden onset aphasia, AMS. JL

TECHNIQUE

All CT scans at this facility use dose modulation, iterative reconstruction, and/or weight based
dosing when appropriate to reduce radiation dose to as low as reasonably achievable.

Number of previous computed tomography exams in the last 12 months is 0  .

Number of previous nuclear medicine myocardial perfusion studies in the last 12 months is 0  .

COMPARISONS

May 16, 2017

FINDINGS

Diffuse cortical atrophy with proportional enlargement of the ventricular system is re-
demonstrated. Diminished attenuation throughout the supratentorial white matter is noted consistent
with underlying small vessel ischemic change. Tiny lacunar infarct is noted within the right sub
insular region unchanged from prior exam. No intracranial hemorrhage or mass effect is seen. Bony
calvarium is normal.

IMPRESSION

Cortical atrophy as described with extensive small vessel ischemic change unchanged from prior
exam. No intracranial hemorrhage or mass effect is seen.

Tech Notes:

CTDI 38.99. Sudden onset aphasia, AMS. JL

## 2020-05-03 IMAGING — MR Head^Brain
10 series · 46 of 48 positions shown · non-contrast
Comparison: none

[Series 2: T1 · sagittal · 5.0mm · 0.45mm/px · 3 of 20 slices shown (1 of 2)]
[im 1/20]
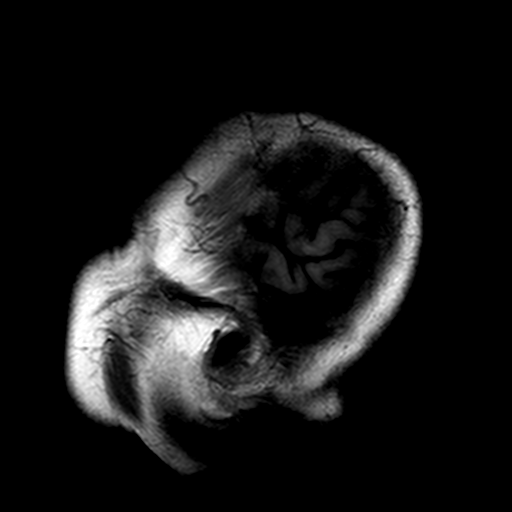
[im 10/20]
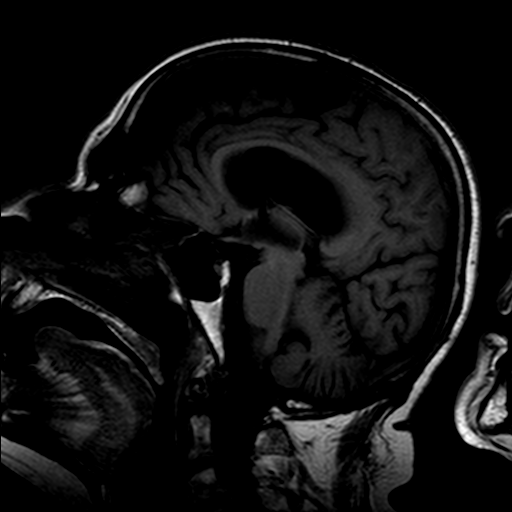
[im 20/20]
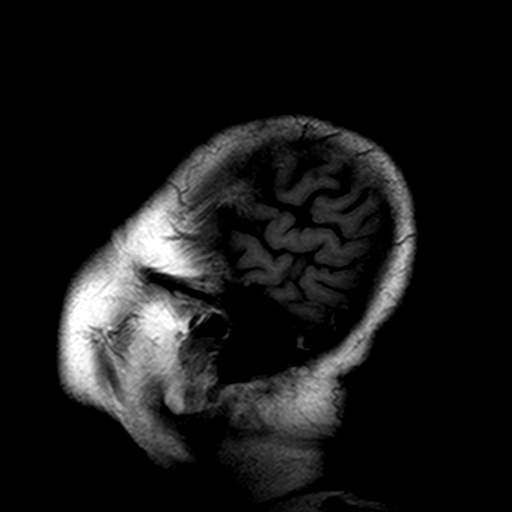

[Series 3: DWI · axial · 5.0mm · 1.80mm/px · z∈[-37,+83]mm · 11 of 62 slices shown (1 of 2)]
[im 1/62]
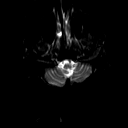
[im 7/62]
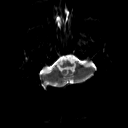
[im 13/62]
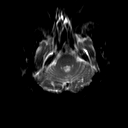
[im 19/62]
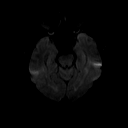
[im 25/62]
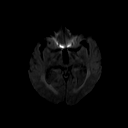
[im 31/62]
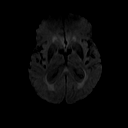
[im 37/62]
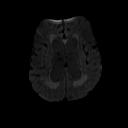
[im 43/62]
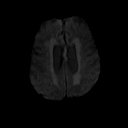
[im 49/62]
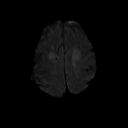
[im 55/62]
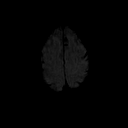
[im 62/62]
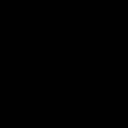

[Series 4: DWI · axial · 5.0mm · 1.80mm/px · z∈[-37,+83]mm · 4 of 21 slices shown (2 of 2)]
[im 1/21]
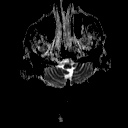
[im 7/21]
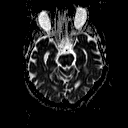
[im 14/21]
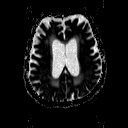
[im 21/21]
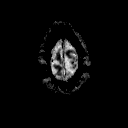

[Series 5: FLAIR · axial · 5.0mm · 0.45mm/px · z∈[-32,+100]mm · 4 of 24 slices shown]
[im 1/24]
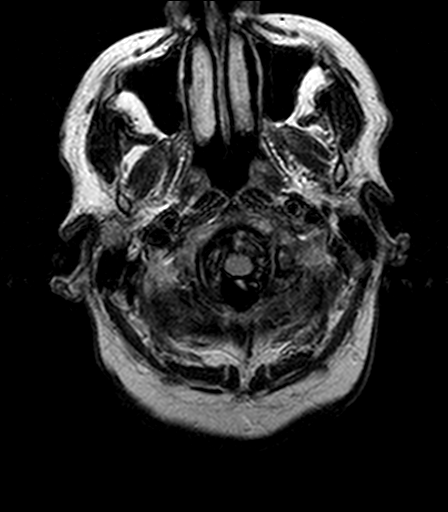
[im 8/24]
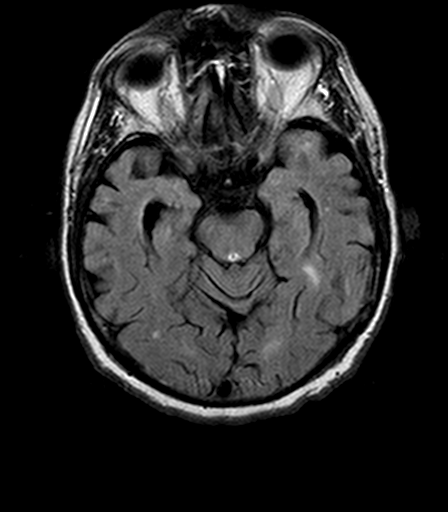
[im 16/24]
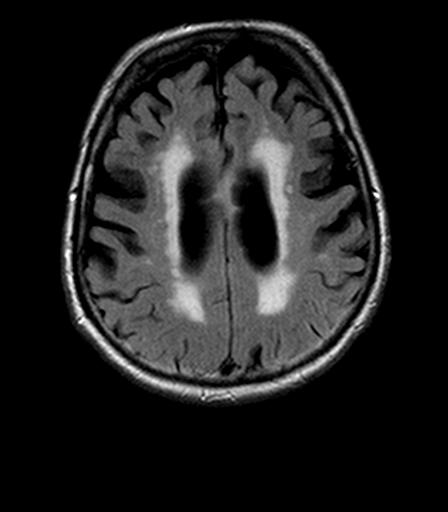
[im 24/24]
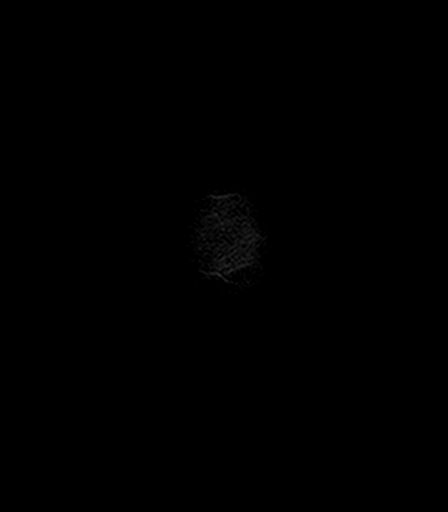

[Series 6: T2 · axial · 5.0mm · 0.72mm/px · z∈[-32,+100]mm · 4 of 24 slices shown (1 of 2)]
[im 1/24]
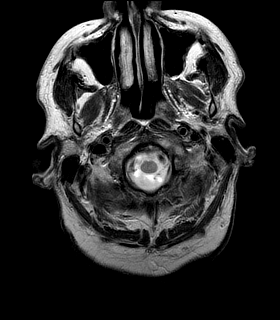
[im 8/24]
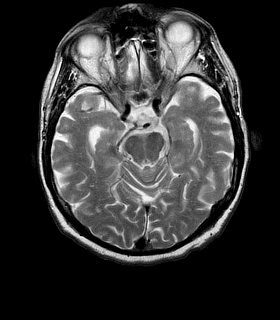
[im 16/24]
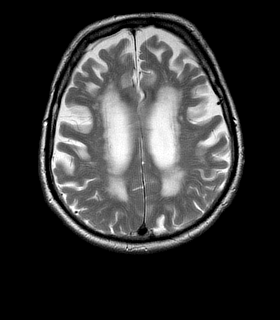
[im 24/24]
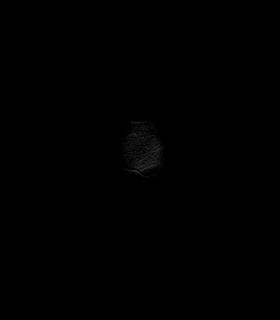

[Series 7: T1 · axial · 5.0mm · 0.45mm/px · z∈[-27,+106]mm · 4 of 24 slices shown (2 of 2)]
[im 1/24]
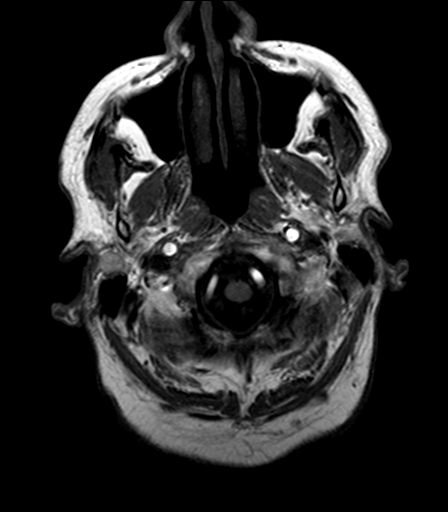
[im 8/24]
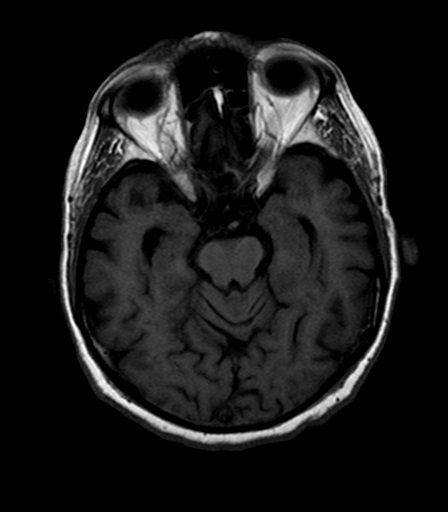
[im 16/24]
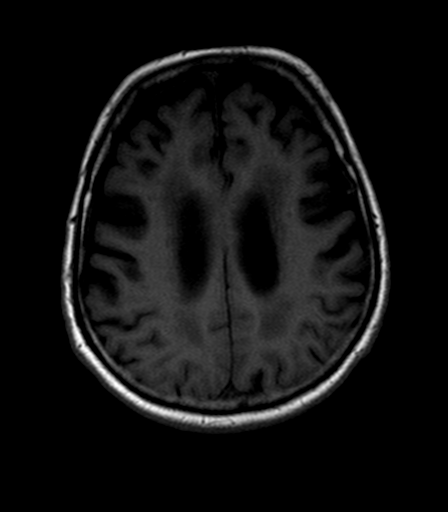
[im 24/24]
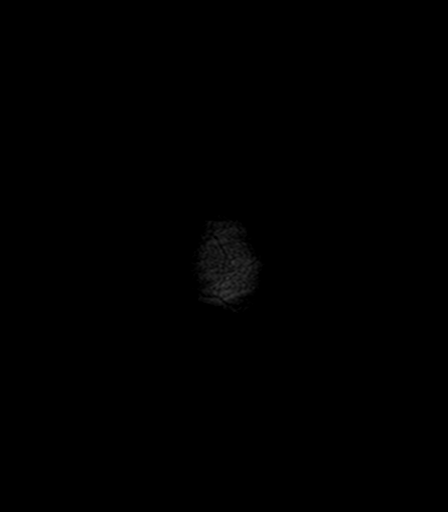

[Series 8: axial blood · axial · 5.0mm · 0.45mm/px · z∈[-24,+16]mm · 2 of 24 slices shown]
[im 1/24]
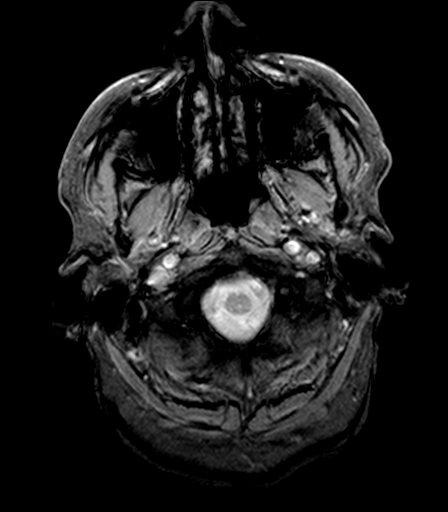
[im 8/24]
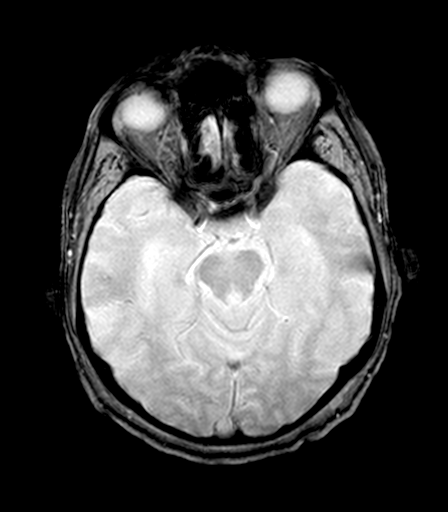

[Series 9: T2 · coronal · 5.0mm · 0.69mm/px · 5 of 26 slices shown (2 of 2)]
[im 1/26]
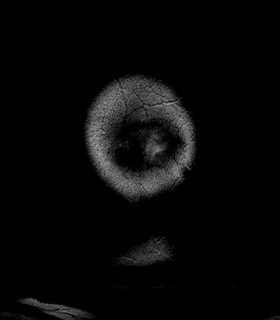
[im 7/26]
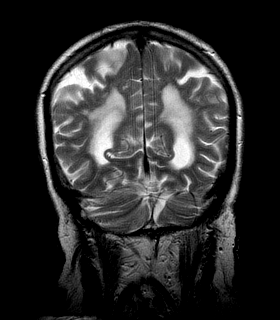
[im 13/26]
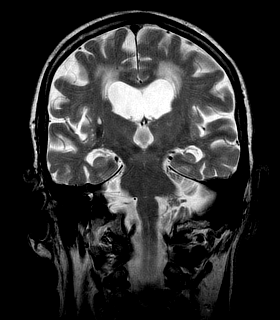
[im 19/26]
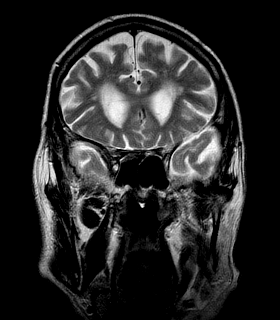
[im 26/26]
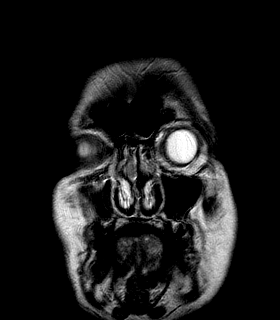

[Series 10: T1 fat-sat post-contrast · axial · 5.0mm · 0.90mm/px · z∈[-32,+100]mm · 4 of 24 slices shown (1 of 2)]
[im 1/24]
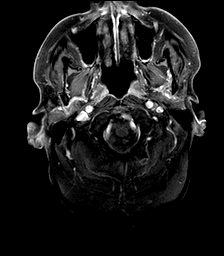
[im 8/24]
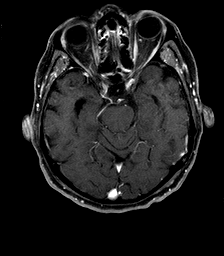
[im 16/24]
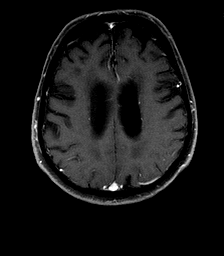
[im 24/24]
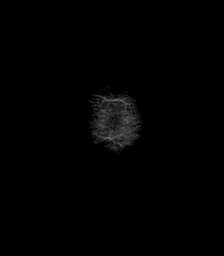

[Series 11: T1 fat-sat post-contrast · coronal · 5.0mm · 0.90mm/px · 5 of 26 slices shown (2 of 2)]
[im 1/26]
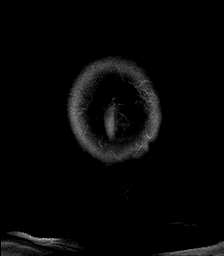
[im 7/26]
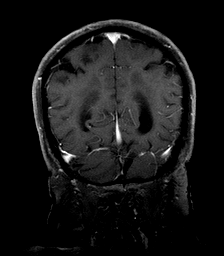
[im 13/26]
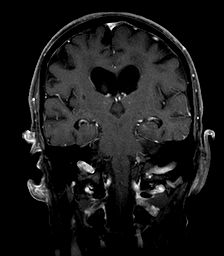
[im 19/26]
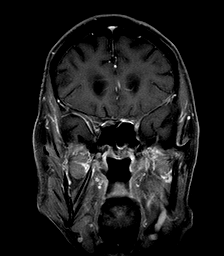
[im 26/26]
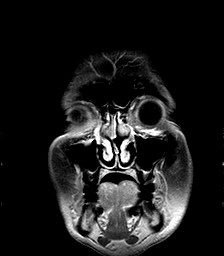

[46 of 48 positions shown; findings below may reference images not displayed]

DIAGNOSTIC STUDIES

EXAM

MRI of the brain with without contrast.

INDICATION

sudden onset aphasia
ACUTE ONSET APHASIA, PT STATES "GOD WAS TALKING TO HIM IN BABBLES" LASTED LESS THAN A DAY.
RESOLVED YESTERDAY.  15 ML GADAVIST.  RG

TECHNIQUE

Sagittal, axial, and coronal images were obtained with variable T1 and T2 weighting before and
after administration of intravenous contrast.

COMPARISONS

None available

FINDINGS

No abnormal signal properties of the brainstem or visualized cervical cord are seen. The expected
signal voids are noted throughout the major intracranial vascular structures, mastoid air cells, and
paranasal sinuses.

There is marked cortical atrophy and proportional enlargement of the ventricular system. Extensive
T2 signal is seen throughout the supratentorial white matter consistent with underlying small vessel
ischemic disease. No acute ischemia is seen.

No abnormal enhancement is noted throughout the brain parenchyma.

There is a suggestion of mid brain atrophy with demonstration of the hunting bird sign. This has
been described associations with progressive supranuclear palsy.

IMPRESSION

Extensive cortical atrophy and small vessel ischemic disease. No acute ischemia is seen.

No intracranial hemorrhage, mass effect, or abnormal enhancement.

Findings suggesting mid brain atrophy. Please see above discussion. Report called to Noestoy Matene
at [DATE] p.m..

Tech Notes:

ACUTE ONSET APHASIA, PT STATES "GOD WAS TALKING TO HIM IN BABBLES" LASTED LESS THAN A DAY.  RESOLVED
YESTERDAY.  15 ML GADAVIST.  RG

## 2020-05-06 IMAGING — US CARDUPBI
1 series · 14 of 16 positions shown · non-contrast
Comparison: none

[Series 1: us carotid duplex bi · 14 of 47 slices shown]
[im 1/47]
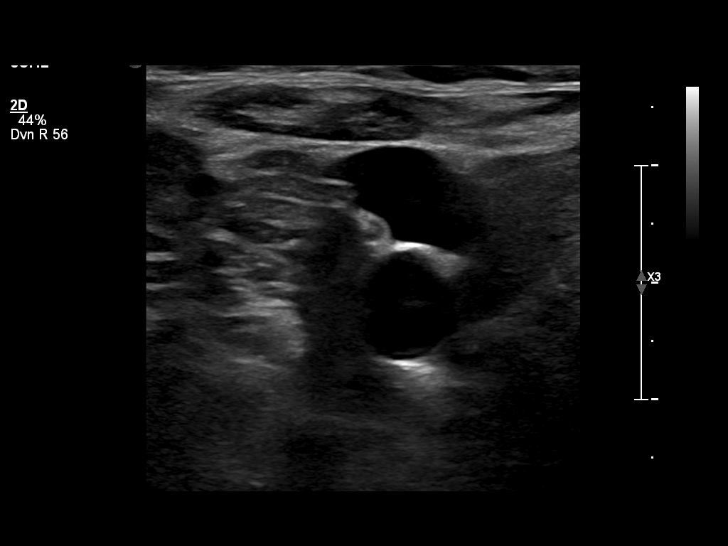
[im 4/47]
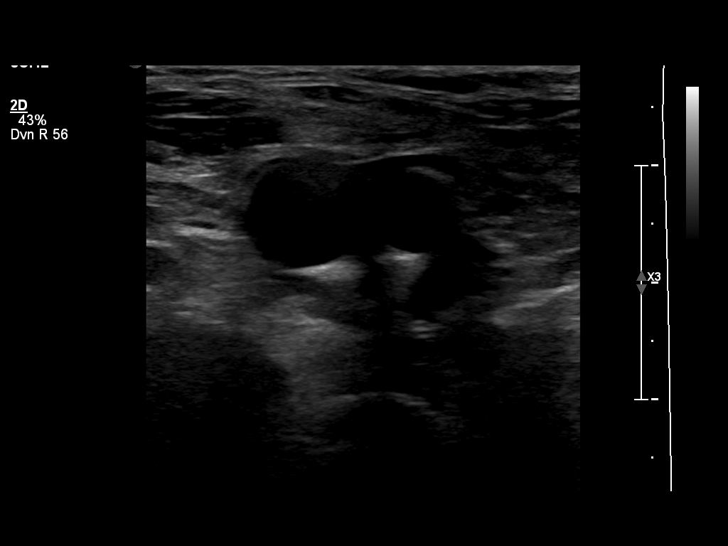
[im 7/47]
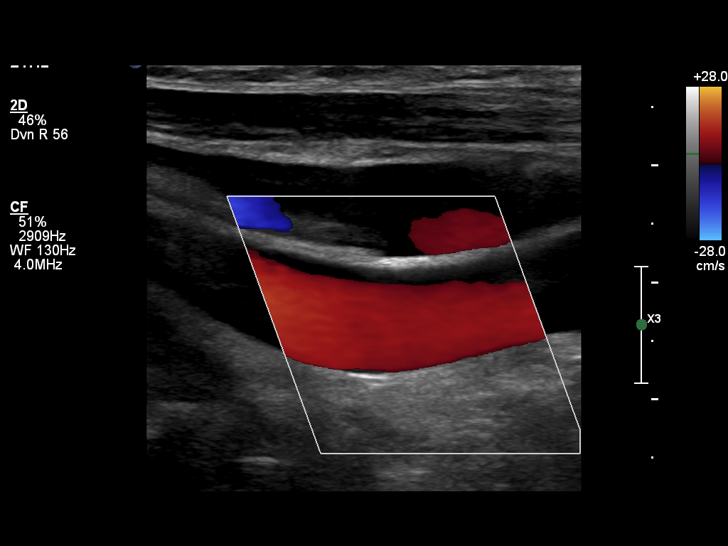
[im 13/47]
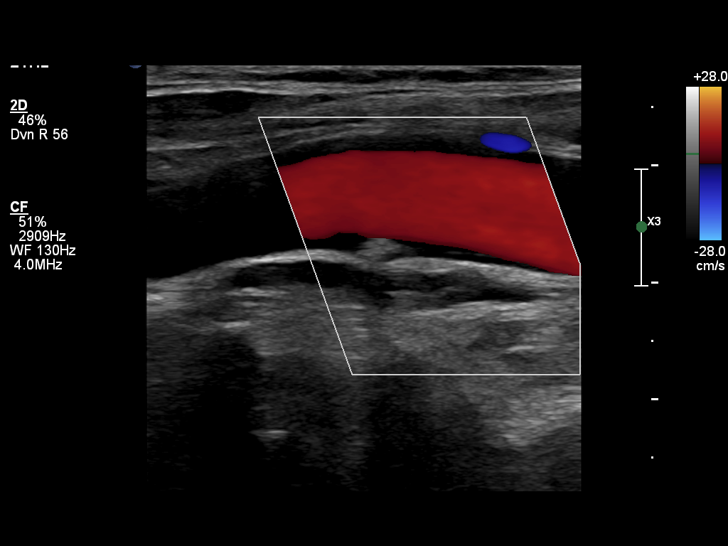
[im 16/47]
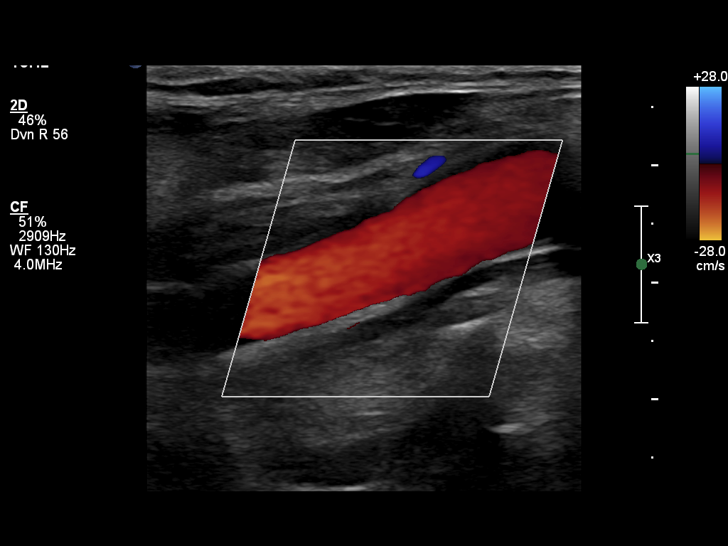
[im 19/47]
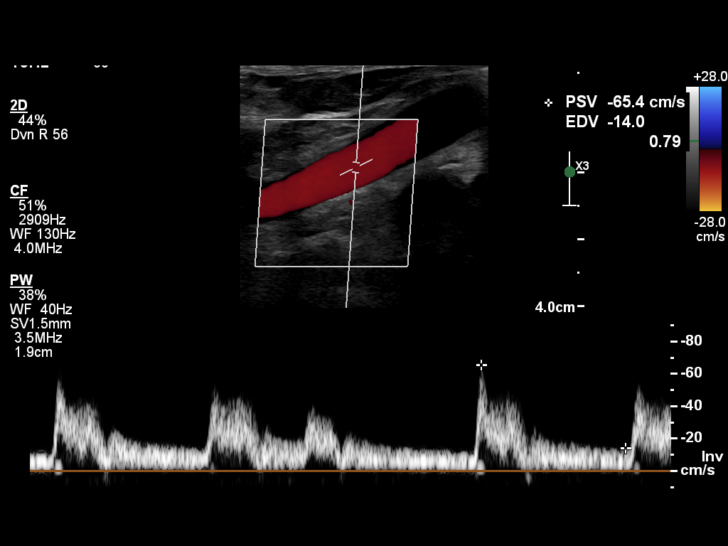
[im 22/47]
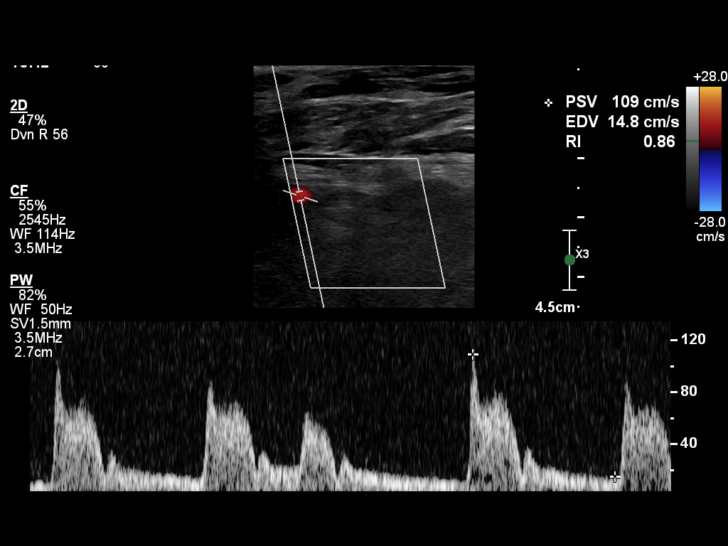
[im 25/47]
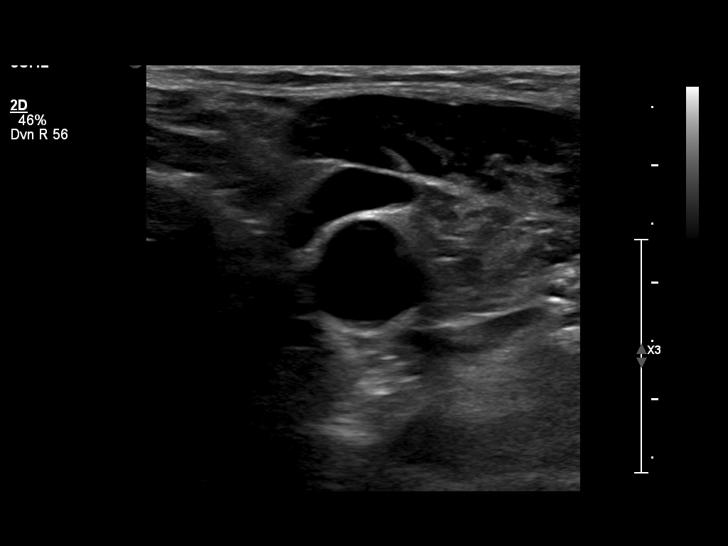
[im 28/47]
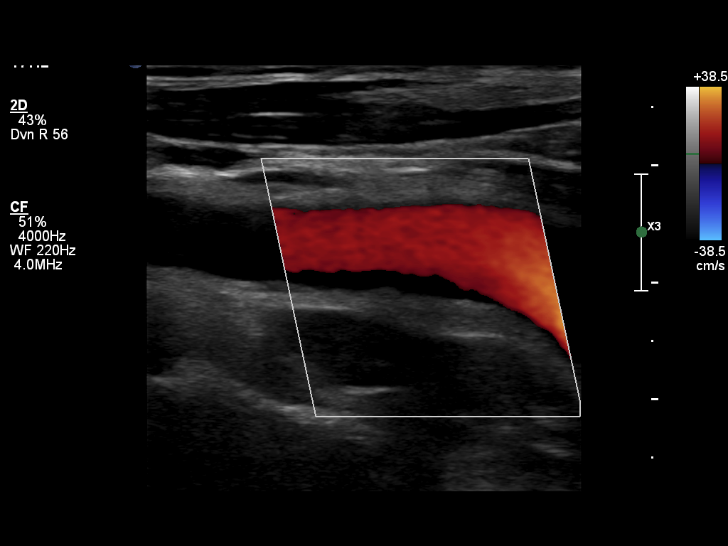
[im 31/47]
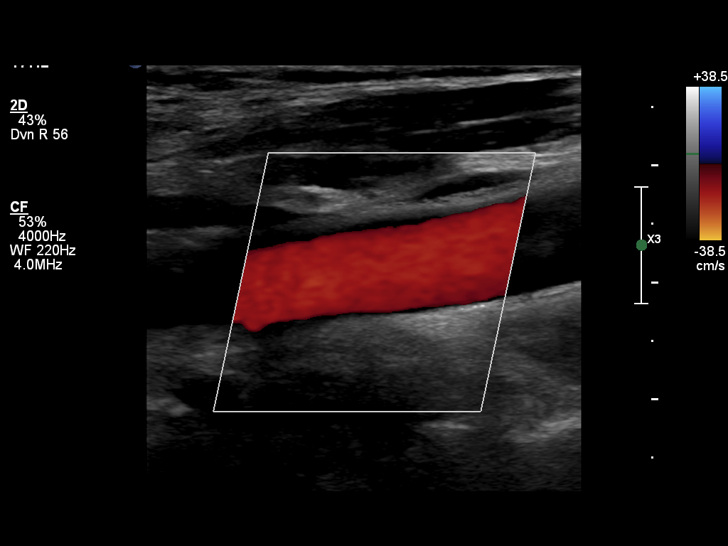
[im 37/47]
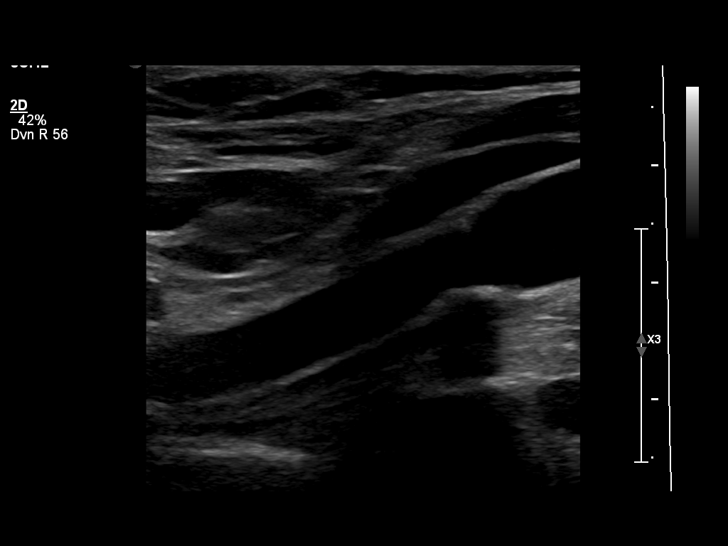
[im 40/47]
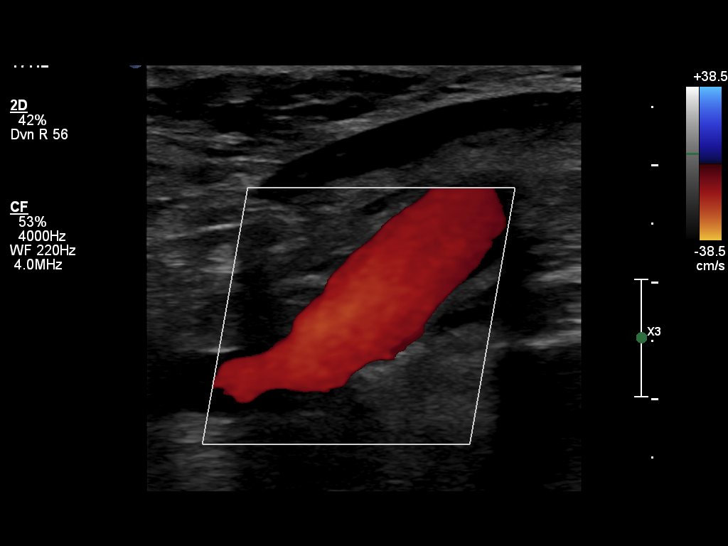
[im 43/47]
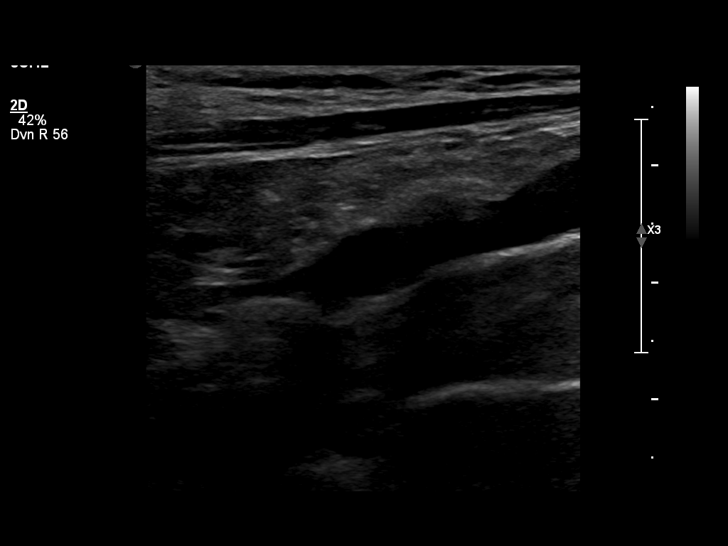
[im 47/47]
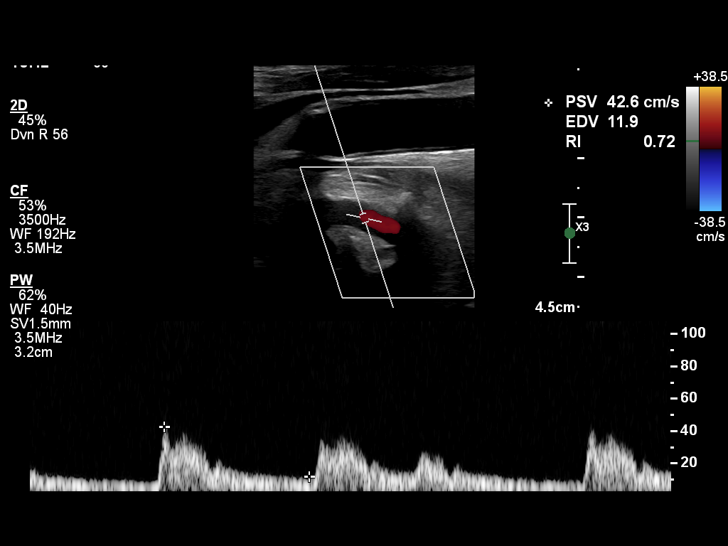

[14 of 16 positions shown; findings below may reference images not displayed]

DIAGNOSTIC STUDIES

EXAM

DUPLEX SCAN OF EXTRACRANIAL ARTERIES; COMPLETE BILATERAL STUDY, CPT 83117

INDICATION

Episode of aphasia. Smoker. Hypertension.

TECHNIQUE

NASCET criteria were utilized.

COMPARISONS

No priors available for comparison.

FINDINGS

Small amount plaque within the carotid bulbs. No velocity elevation. [Antegrade flow within the
vertebral arteries.]

IMPRESSION

1. No hemodynamically significant stenosis within the internal carotid arteries (0 to 49%).

2. Antegrade flow within the vertebral arteries.

Tech Notes:

JL/DS

## 2020-09-08 IMAGING — CR HIPPELBI
5 series · 5 of 5 positions shown · non-contrast
Comparison: none

[hip ap pelvis]
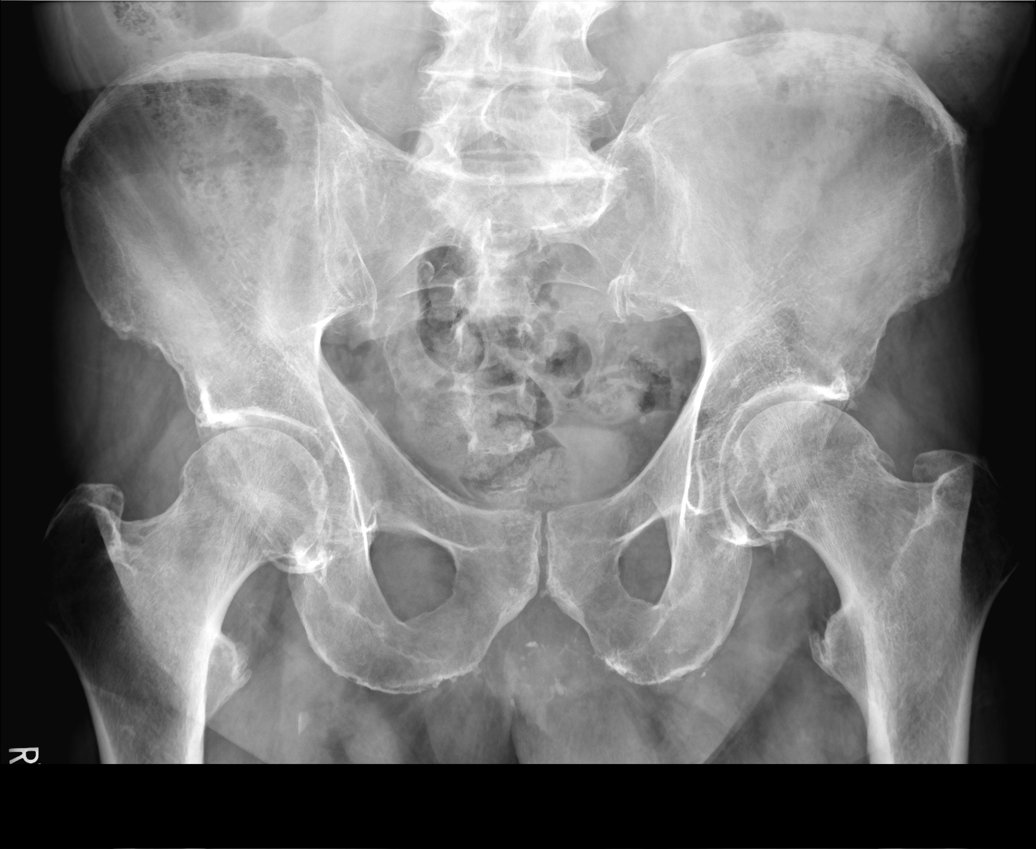

[hip ap (1 of 2)]
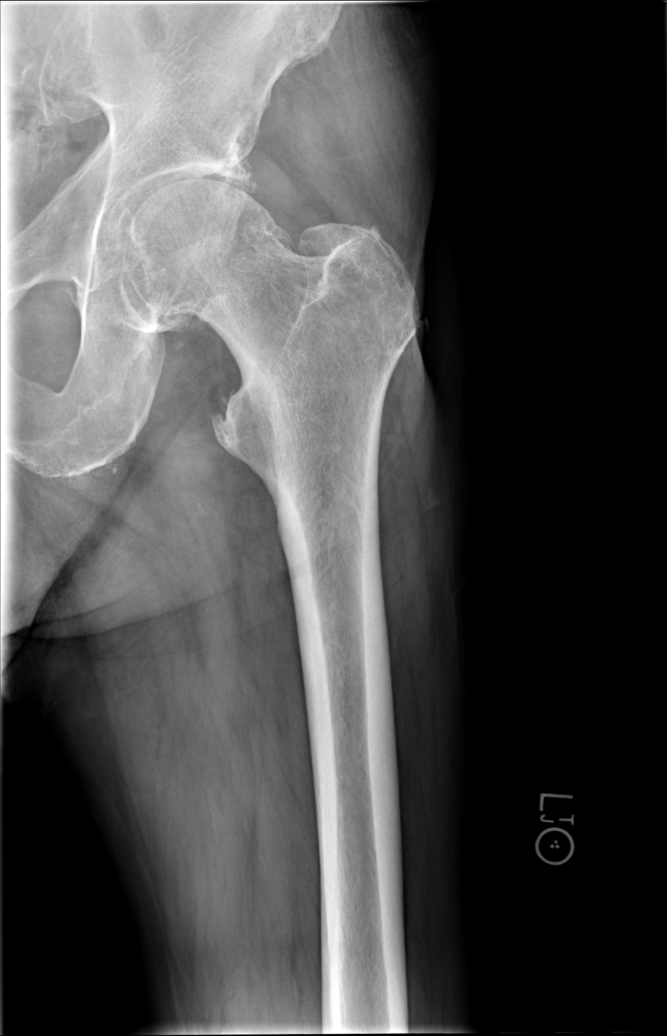

[hip frog lat (1 of 2)]
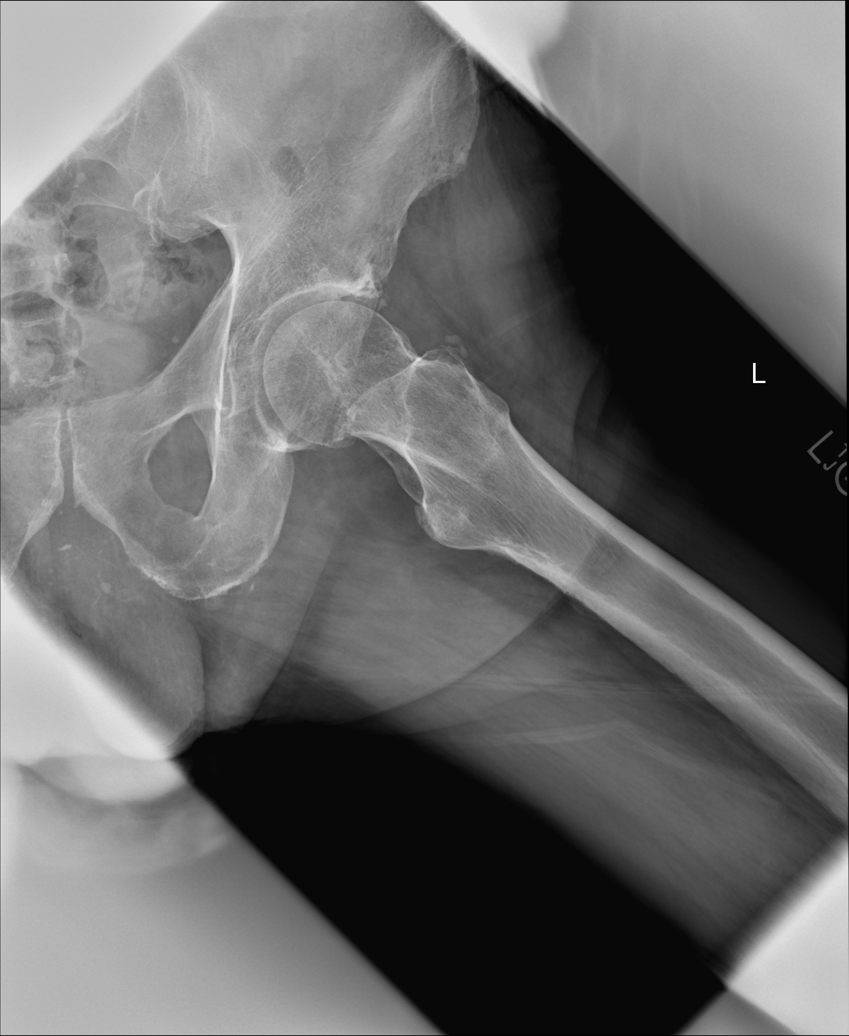

[hip ap (2 of 2)]
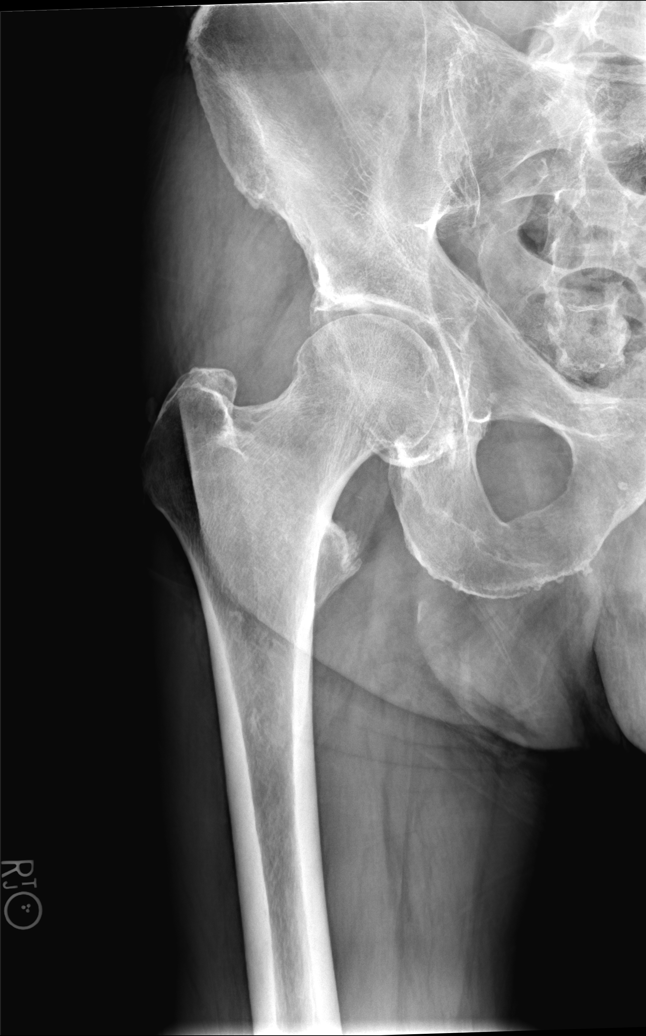

[hip frog lat (2 of 2)]
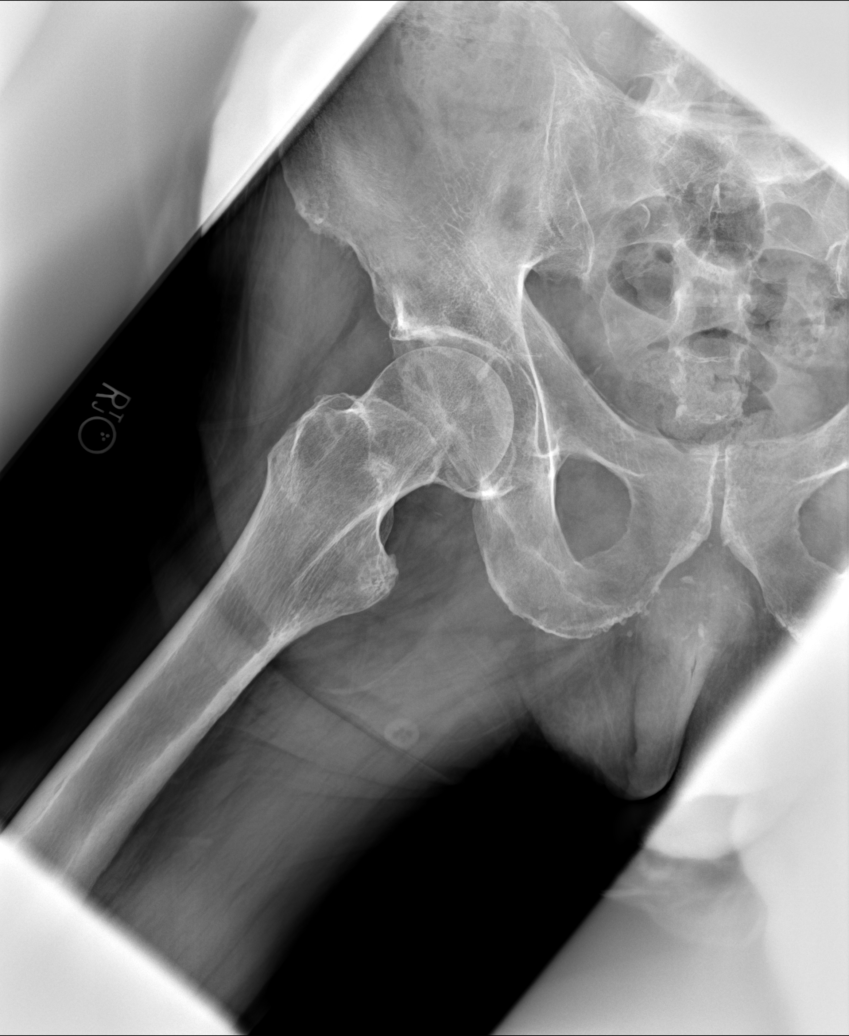

[5 of 5 positions shown; findings below may reference images not displayed]

EXAM

XR hip BI w [id number]

INDICATION

Bilateral hip pain

TECHNIQUE

AP view of the pelvis and AP with frogleg lateral views of the right left hips

COMPARISONS

None available at the time of dictation.

FINDINGS

No radiographic evidence of an acute fracture, osseous malalignment, or aggressive focal osseous
lesion. There is anatomic joint alignment. Small left os acetabuli versus osteophyte. Enthesophyte
ptosis at bilateral lesser trochanters and over the left greater trochanter with fluffy
calcifications which may represent superimposed calcific tendinitis of the gluteal tendon
insertions. Indeterminate possible periosteal reaction over the right greater the left iliopectineal
lines, which may be reflective of subtle insufficiency fractures. Alternatively, this may be
projectional.

IMPRESSION
1. No radiographic evidence of an acute osseous abnormality.
2. Subtle smooth periosteal reaction along the right greater the left iliopectineal lines above the
superior pubic rami. Imaging findings may be secondary to insufficiency fractures though no discrete
fracture plane is identified. If continued clinical concern, recommend CT versus MRI.

Tech Notes:

BILATERAL HIP PAIN WITH GAIT INSTABILITY.  RG

## 2020-12-09 ENCOUNTER — Encounter: Admit: 2020-12-09 | Discharge: 2020-12-09 | Payer: MEDICARE

## 2020-12-09 ENCOUNTER — Ambulatory Visit: Admit: 2020-12-09 | Discharge: 2020-12-09 | Payer: MEDICARE

## 2020-12-09 DIAGNOSIS — R4189 Other symptoms and signs involving cognitive functions and awareness: Secondary | ICD-10-CM

## 2020-12-09 DIAGNOSIS — IMO0002 Squamous cell carcinoma: Secondary | ICD-10-CM

## 2020-12-09 DIAGNOSIS — Z049 Encounter for examination and observation for unspecified reason: Secondary | ICD-10-CM

## 2020-12-09 DIAGNOSIS — I10 Essential (primary) hypertension: Secondary | ICD-10-CM

## 2020-12-09 DIAGNOSIS — G629 Polyneuropathy, unspecified: Secondary | ICD-10-CM

## 2020-12-09 NOTE — Patient Instructions
You do nat has PSP or any type of parkinsonism

## 2020-12-09 NOTE — Progress Notes
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 He was found to have the proverbial 'hummingbird sign' of his midbrain and the possible diagnosis of PSP was raised. He does not complaint of slowness or movement, rigidity, frequent calls of cognitive problems. He is actively practicing medicine.    Family history: Oddly enough I saw his first cousin as my first patient of the day for an unrelated disorder.    Review of Systems   Constitutional: Positive for activity change.   HENT: Positive for rhinorrhea and trouble swallowing.    Eyes: Negative.    Respiratory: Negative.    Cardiovascular: Negative.    Gastrointestinal: Negative.    Endocrine: Negative.    Genitourinary: Positive for enuresis.   Musculoskeletal: Positive for arthralgias and gait problem.   Skin: Negative.    Allergic/Immunologic: Negative.    Hematological: Negative.    Psychiatric/Behavioral: Negative.      Medical History:   Diagnosis Date   ? Disorganized thinking    ? Hypertension    ? Neuropathy    ? Squamous cell carcinoma      No past surgical history on file.  Family History   Problem Relation Age of Onset   ? Stroke Mother    ? Motor Neuron Disease Father      Social History     Socioeconomic History   ? Marital status: Married   Tobacco Use   ? Smoking status: Current Every Day Smoker     Packs/day: 0.50     Years: 50.00     Pack years: 25.00     Types: Cigarettes   ? Smokeless tobacco: Never Used   Substance and Sexual Activity   ? Alcohol use: Yes     Comment: socail   ? Drug use: No                     Medications:         ? acitretin (SORIATANE) 25 mg cap Take 1 Cap by mouth daily with dinner.   ? amLODIPine (NORVASC) 10 mg tablet Take 10 mg by mouth daily.     ? calcium carbonate (OS-CAL) 1250 mg tablet Take 1,250 mg by mouth daily.     ? cholecalciferol (vitamin D3) (VITAMIN D3) 1,000 units tablet Take 5,000 Units by mouth daily.     ? doxepin (SINEQUAN) 10 mg capsule Take 10 mg up to 50 mg nightly as needed for itching   ? folic acid (FOLVITE) 1 mg tablet Daily   ? hydrochlorothiazide (HYDRODIURIL) 25 mg tablet Take 25 mg by mouth daily.     ? lisinopriL (ZESTRIL) 40 mg tablet Take 40 mg by mouth daily.     ? metoprolol succinate XL (TOPROL XL) 100 mg extended release tablet 50 mg.   ? MULTIVITAMIN PO Take  by mouth.     ? nebivoloL (BYSTOLIC) 20 mg tablet Take 20 mg by mouth daily.     ? other medication 1 Dose. Tumeric      ? potassium chloride SR (K-DUR) 20 mEq tablet Take 20 mEq by mouth daily.     ? spironolactone (ALDACTONE) 25 mg tablet Take 25 mg by mouth daily.   ? tamsulosin (FLOMAX) 0.4  mg capsule    ? telmisartan (MICARDIS) 80 mg tablet Take 80 mg by mouth daily.         Objective:  Vitals:    12/09/20 0940   BP: (!) 165/89   Pulse: 74   PainSc: One  Comment: left shoulder   Weight: 72.6 kg (160 lb)   Height: 177.8 cm (5' 10)     Body mass index is 22.96 kg/m?Marland Kitchen     Physical Exam    Well developed tall thin gentleman seated in the examination room.    Neurological examination:  Mental Status Examiantion: He is alert and cooperative. He arrived at the proper location on time and was able to independently present his history and current complaints. His language functions are normal as are cognitive and memory functions.    Cranial Nerve Examination: Normal visual fields, normal EOMs (specifically normal vertical gaze when following my finger and spontaneous normal vertical and horizontal gaze), muscles of facial expression, hearing, tongue protrusion, and neck rotation were all normal     Motor: He has normal muscle tone and bulk. He had no evidence of a pronator drift.    Sensory: is intact for proprioception    Coordination: He performed normally on rapid alternating movements. He has normal spontaneous movements and no evidence of tremor. His stance and gait were wide based and his gait was somewhat slow. He relied upon a cane for support..         Assessment and Plan:    Problem   Observation for Suspected Condition    He was suspected to have PSP due to a specific finding of midbrain atrophy, despite the complete lack clinical findings of parkinsonism. His examination showed normal EOMs thus he can not have PSP. WHile he has a wide based gait it is not a parkinsonian gait.          Observation for suspected condition  I reassured Tyler Ashley his wife and their daughter that he does not have PSP or PD and that he does not need treatment for those disorders. While he has cerebral atrophy and enlarged ventricle he does not have the clinical triad of NPH of magnetic gait urinary incontinence and cognitive impairment.    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

## 2020-12-10 NOTE — Assessment & Plan Note
I reassured Tyler Ashley his wife and their daughter that he does not have PSP or PD and that he does not need treatment for those disorders. While he has cerebral atrophy and enlarged ventricle he does not have the clinical triad of NPH of magnetic gait urinary incontinence and cognitive impairment.    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

## 2021-01-07 ENCOUNTER — Inpatient Hospital Stay: Admit: 2021-01-07 | Discharge: 2021-01-07 | Payer: MEDICARE

## 2021-01-07 ENCOUNTER — Encounter: Admit: 2021-01-07 | Discharge: 2021-01-07 | Payer: MEDICARE

## 2021-01-07 ENCOUNTER — Inpatient Hospital Stay: Admit: 2021-01-07 | Payer: MEDICARE

## 2021-01-07 DIAGNOSIS — I609 Nontraumatic subarachnoid hemorrhage, unspecified: Secondary | ICD-10-CM

## 2021-01-07 LAB — CBC: WBC COUNT: 13 K/UL — ABNORMAL HIGH (ref 4.5–11.0)

## 2021-01-07 LAB — COMPREHENSIVE METABOLIC PANEL
ALBUMIN: 4.3 g/dL (ref 3.5–5.0)
ALK PHOSPHATASE: 87 U/L (ref 25–110)
ALT: 12 U/L (ref 7–56)
ANION GAP: 10 (ref 3–12)
AST: 20 U/L (ref 7–40)
CALCIUM: 10 mg/dL (ref 8.5–10.6)
CO2: 23 MMOL/L (ref 21–30)
CREATININE: 0.5 mg/dL (ref 0.4–1.24)
EGFR: >60 mL/min (ref >60)
SODIUM: 134 MMOL/L — ABNORMAL LOW (ref 137–147)
TOTAL BILIRUBIN: 1 mg/dL (ref 0.3–1.2)
TOTAL PROTEIN: 6.9 g/dL (ref 6.0–8.0)

## 2021-01-07 LAB — LACTIC ACID(LACTATE): LACTIC ACID: 1.8 MMOL/L (ref 0.5–2.0)

## 2021-01-07 LAB — PHOSPHORUS: PHOSPHORUS: 2.1 mg/dL — ABNORMAL LOW (ref 2.0–4.5)

## 2021-01-07 LAB — PROTIME INR (PT): PROTIME: 11 s (ref 9.5–14.2)

## 2021-01-07 LAB — PTT (APTT): PTT: 30 s (ref 24.0–36.5)

## 2021-01-07 LAB — IONIZED CALCIUM: IONIZED CALCIUM: 1.2 MMOL/L — ABNORMAL HIGH (ref 1.0–1.3)

## 2021-01-07 LAB — MAGNESIUM: MAGNESIUM: 1.7 mg/dL — ABNORMAL LOW (ref 1.6–2.6)

## 2021-01-07 IMAGING — CT CHESTWO
2 of 5 series · 15 of 36 positions shown, 18 images · non-contrast
Comparison: none

[Series 14: thorax cor 1.50 br40 s3 · coronal · 0.63mm/px · 3 of 178 slices shown]
[im 36/178  lung]
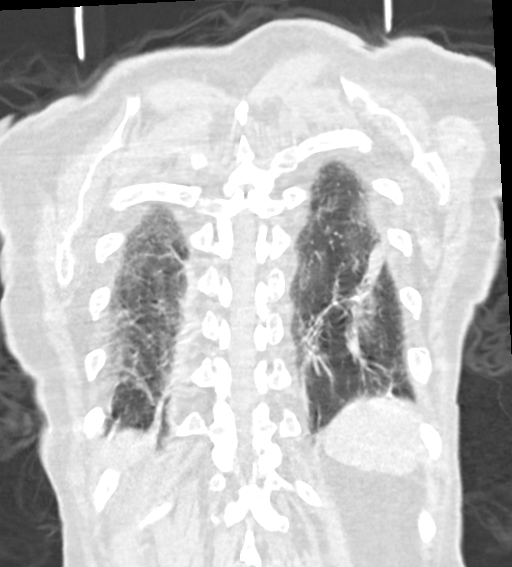
[im 71/178  lung]
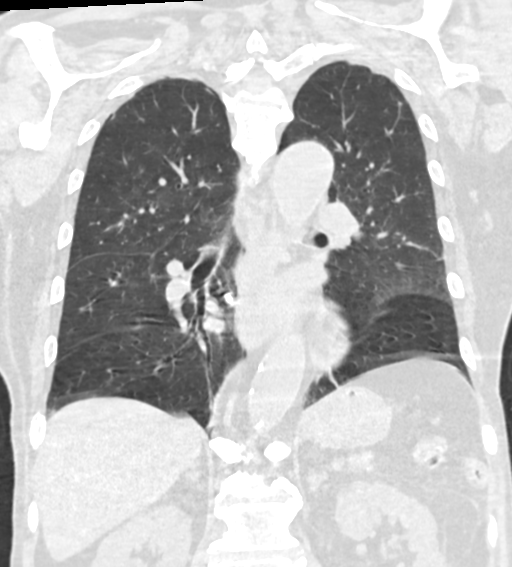
[im 107/178  lung]
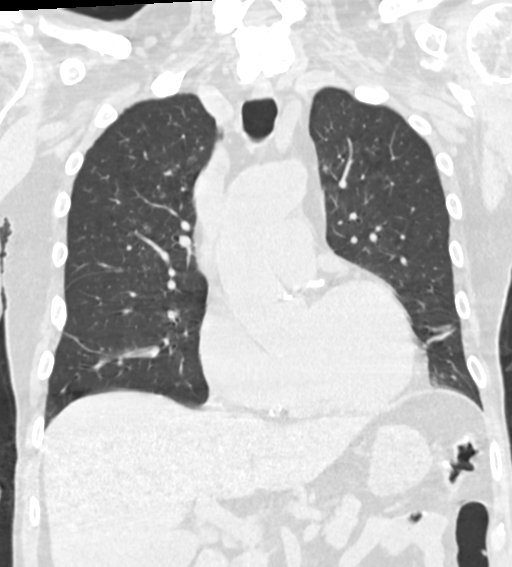

[Series 19: thorax 1.00 br60 s3 · axial · 0.65mm/px · z∈[-923,-594]mm · 12 of 500 slices shown, 15 images]
[im 28/500  mediastinal]
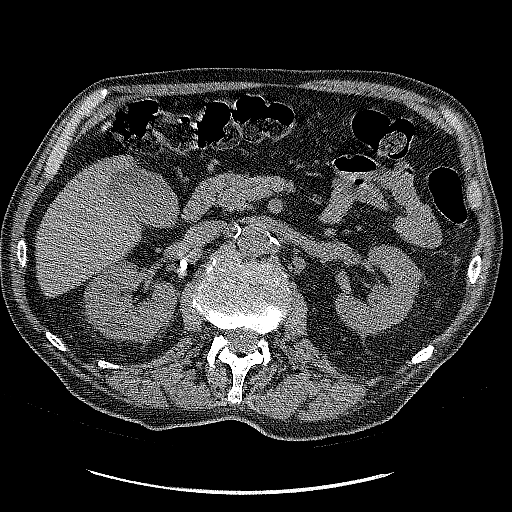
[im 28/500  lung]
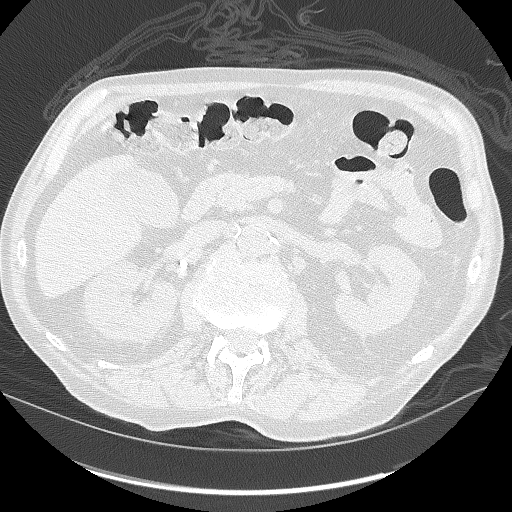
[im 84/500  lung]
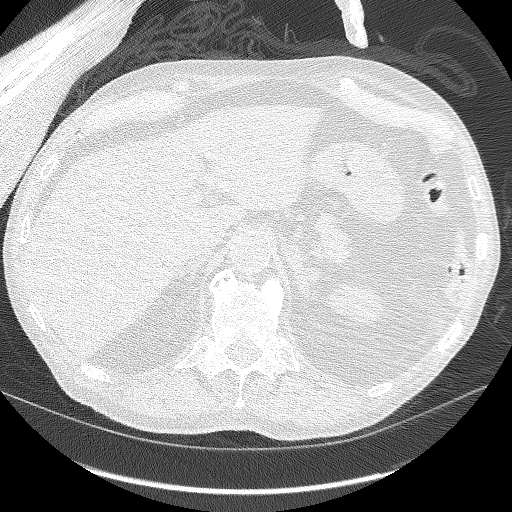
[im 111/500  lung]
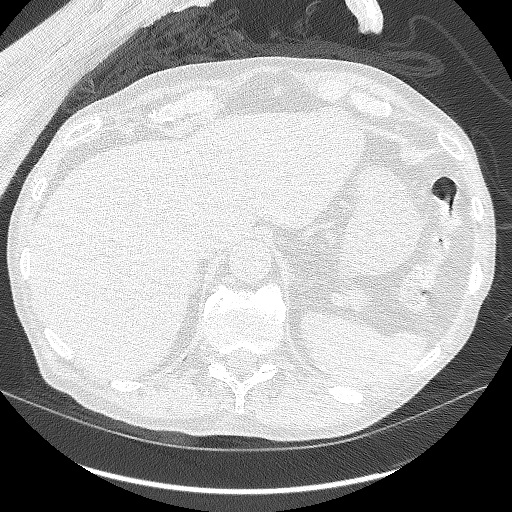
[im 139/500  lung]
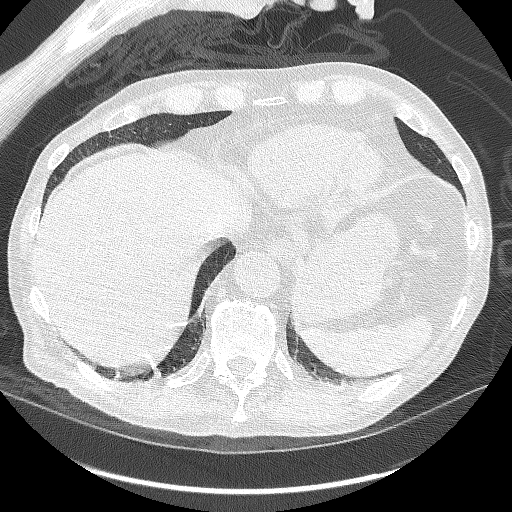
[im 195/500  mediastinal]
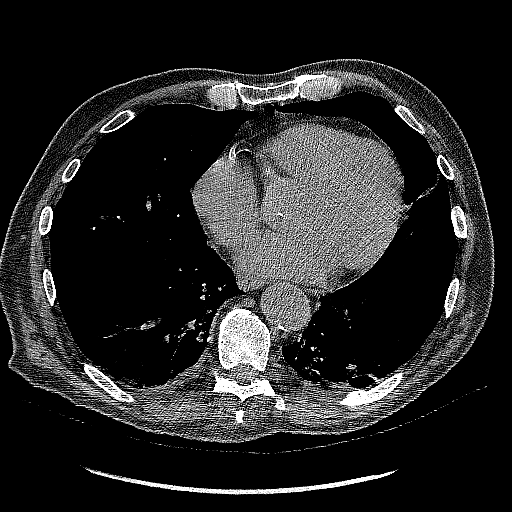
[im 195/500  lung]
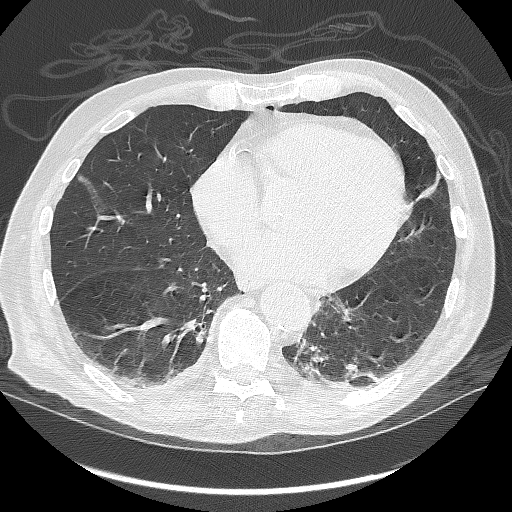
[im 222/500  lung]
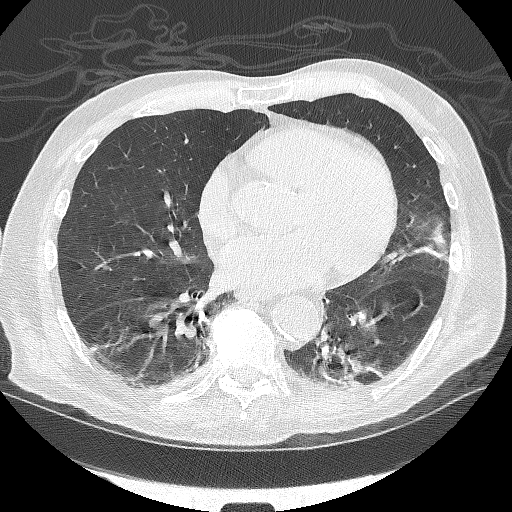
[im 278/500  lung]
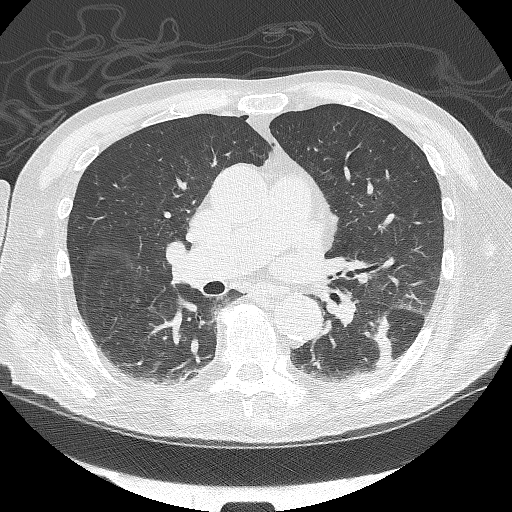
[im 305/500  lung]
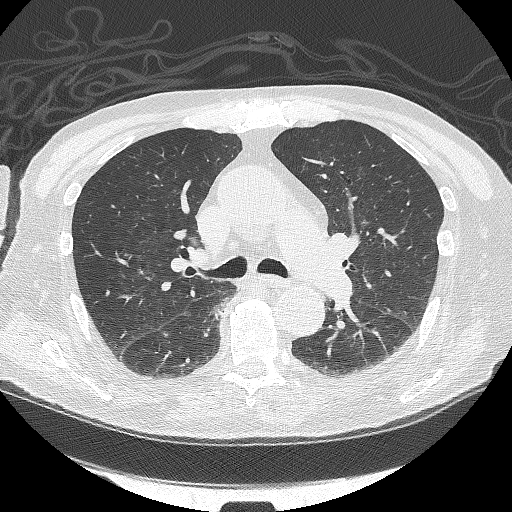
[im 361/500  mediastinal]
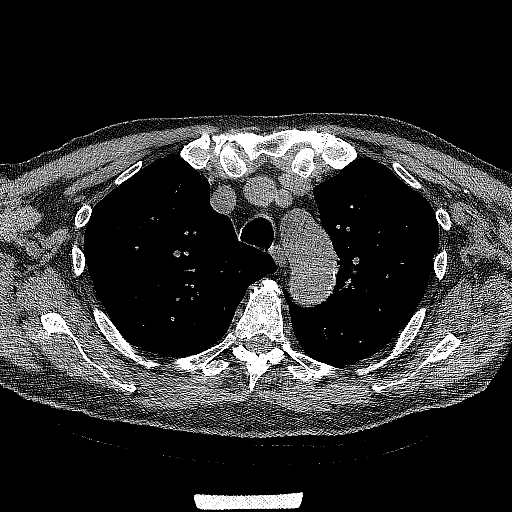
[im 361/500  lung]
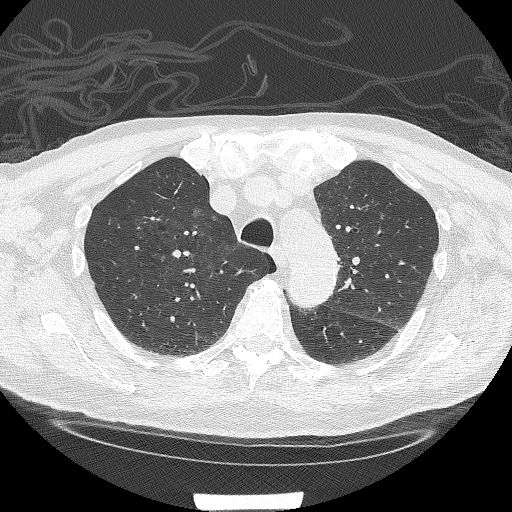
[im 389/500  lung]
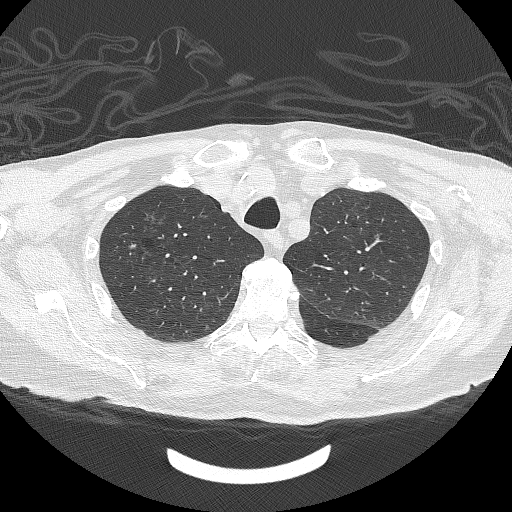
[im 416/500  lung]
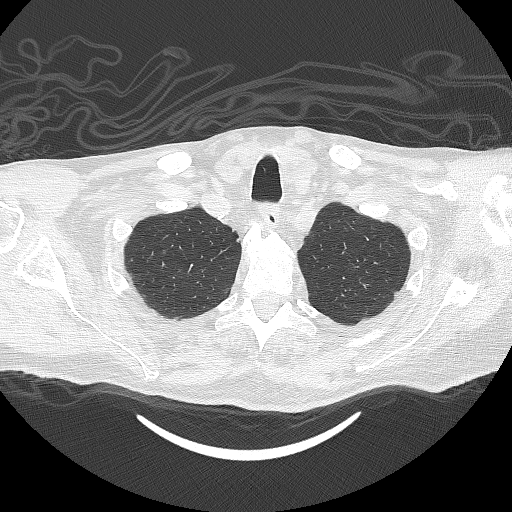
[im 472/500  lung]
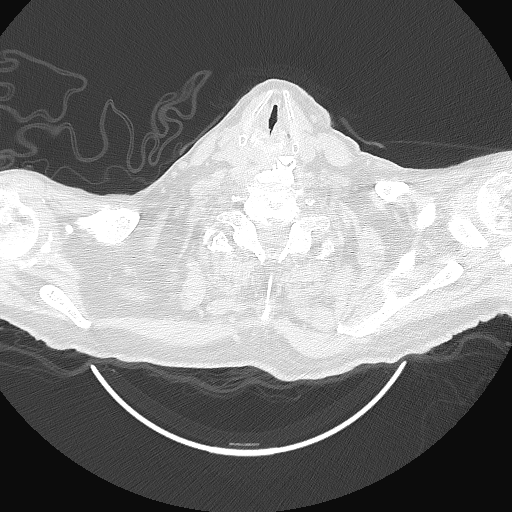

[15 of 36 positions shown; findings below may reference images not displayed]

DIAGNOSTIC STUDIES

EXAM

CT chest wo con

INDICATION

rib fxs
Fall [DATE].  Lt rib pain.  AB

TECHNIQUE

All CT scans at this facility use dose modulation, iterative reconstruction, and/or weight based
dosing when appropriate to reduce radiation dose to as low as reasonably achievable.

Number of previous computed tomography exams in the last 12 months is 0  .

Number of previous nuclear medicine myocardial perfusion studies in the last 12 months is 0  .

COMPARISONS

None available.

FINDINGS

Heart size is normal. Coronary artery calcifications. Thoracic aorta demonstrates mild
atheromatous plaque. No enlarged mediastinal nodes by size criteria.

Dependent atelectasis bilaterally. Trace right pneumothorax. Mild patchy ground-glass nodular
opacities greater in the right upper lobe. More focal nodular component measures up to 3 millimeters
on series 12, image 55. There is mild apical predominant emphysema.

Cholelithiasis.

No fracture or listhesis of the thoracic spine. Thoracic DISH. There are multiple old, healed rib
fractures. There are nondisplaced acute to subacute fractures of ribs 7, 8, and 9 on the left.
Degenerative changes of the shoulder girdles.

IMPRESSION

Trace right pneumothorax. No tension pneumothorax.

Multiple old, healed rib fractures. There are early subacute appearing nondisplaced fractures of
the left ribs 7, 8, and 9.

Dependent atelectasis. Patchy mild centrilobular ground-glass nodular opacities greater in the
right upper lobe, likely infectious or inflammatory.

Mild emphysema.

Findings discussed with Bi Theriault by myself at [DATE] p.m.   01/07/2021

Tech Notes:

Fall [DATE].  Lt rib pain.  AB

## 2021-01-07 IMAGING — CR ELBOWCMLT
3 series · 3 of 3 positions shown · non-contrast
Comparison: none

[x elbow obl left (1 of 2)]
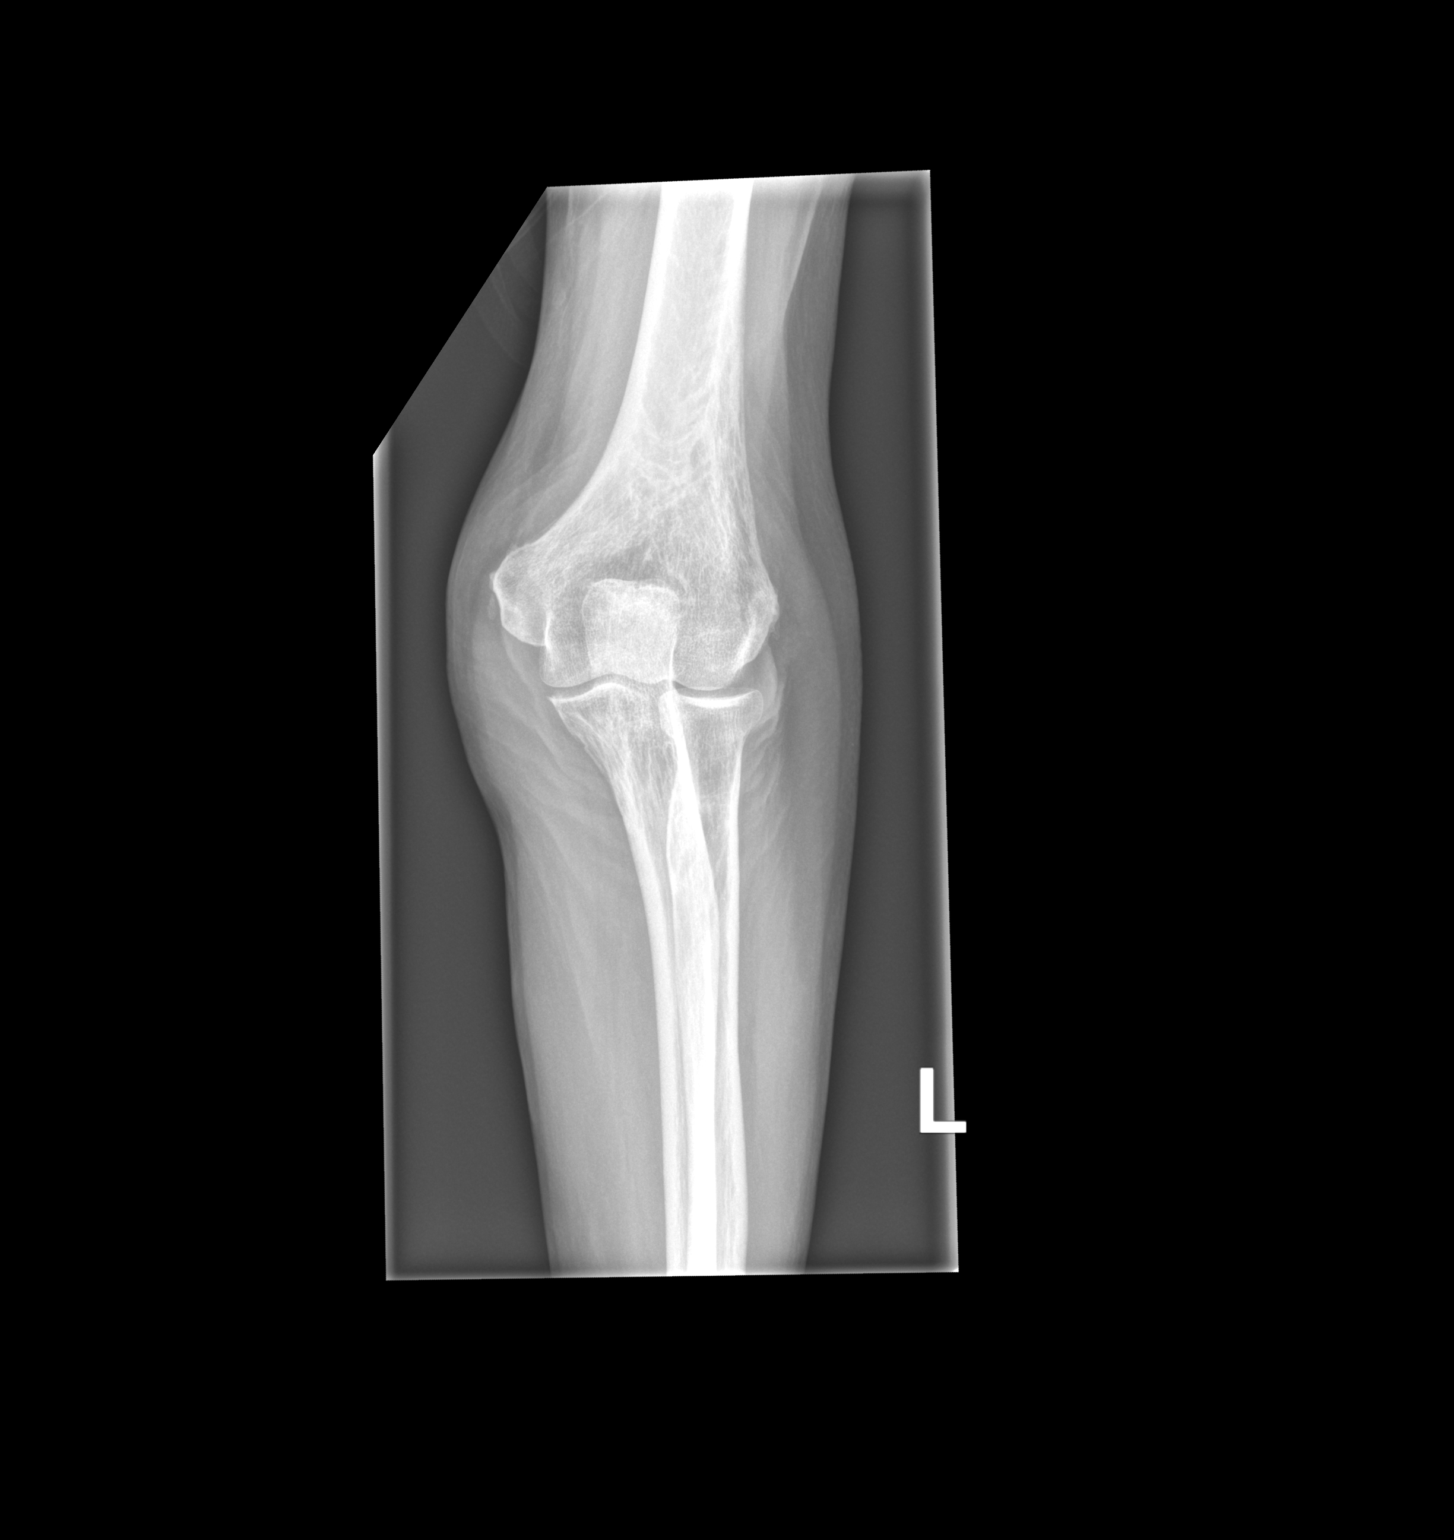

[x elbow obl left (2 of 2)]
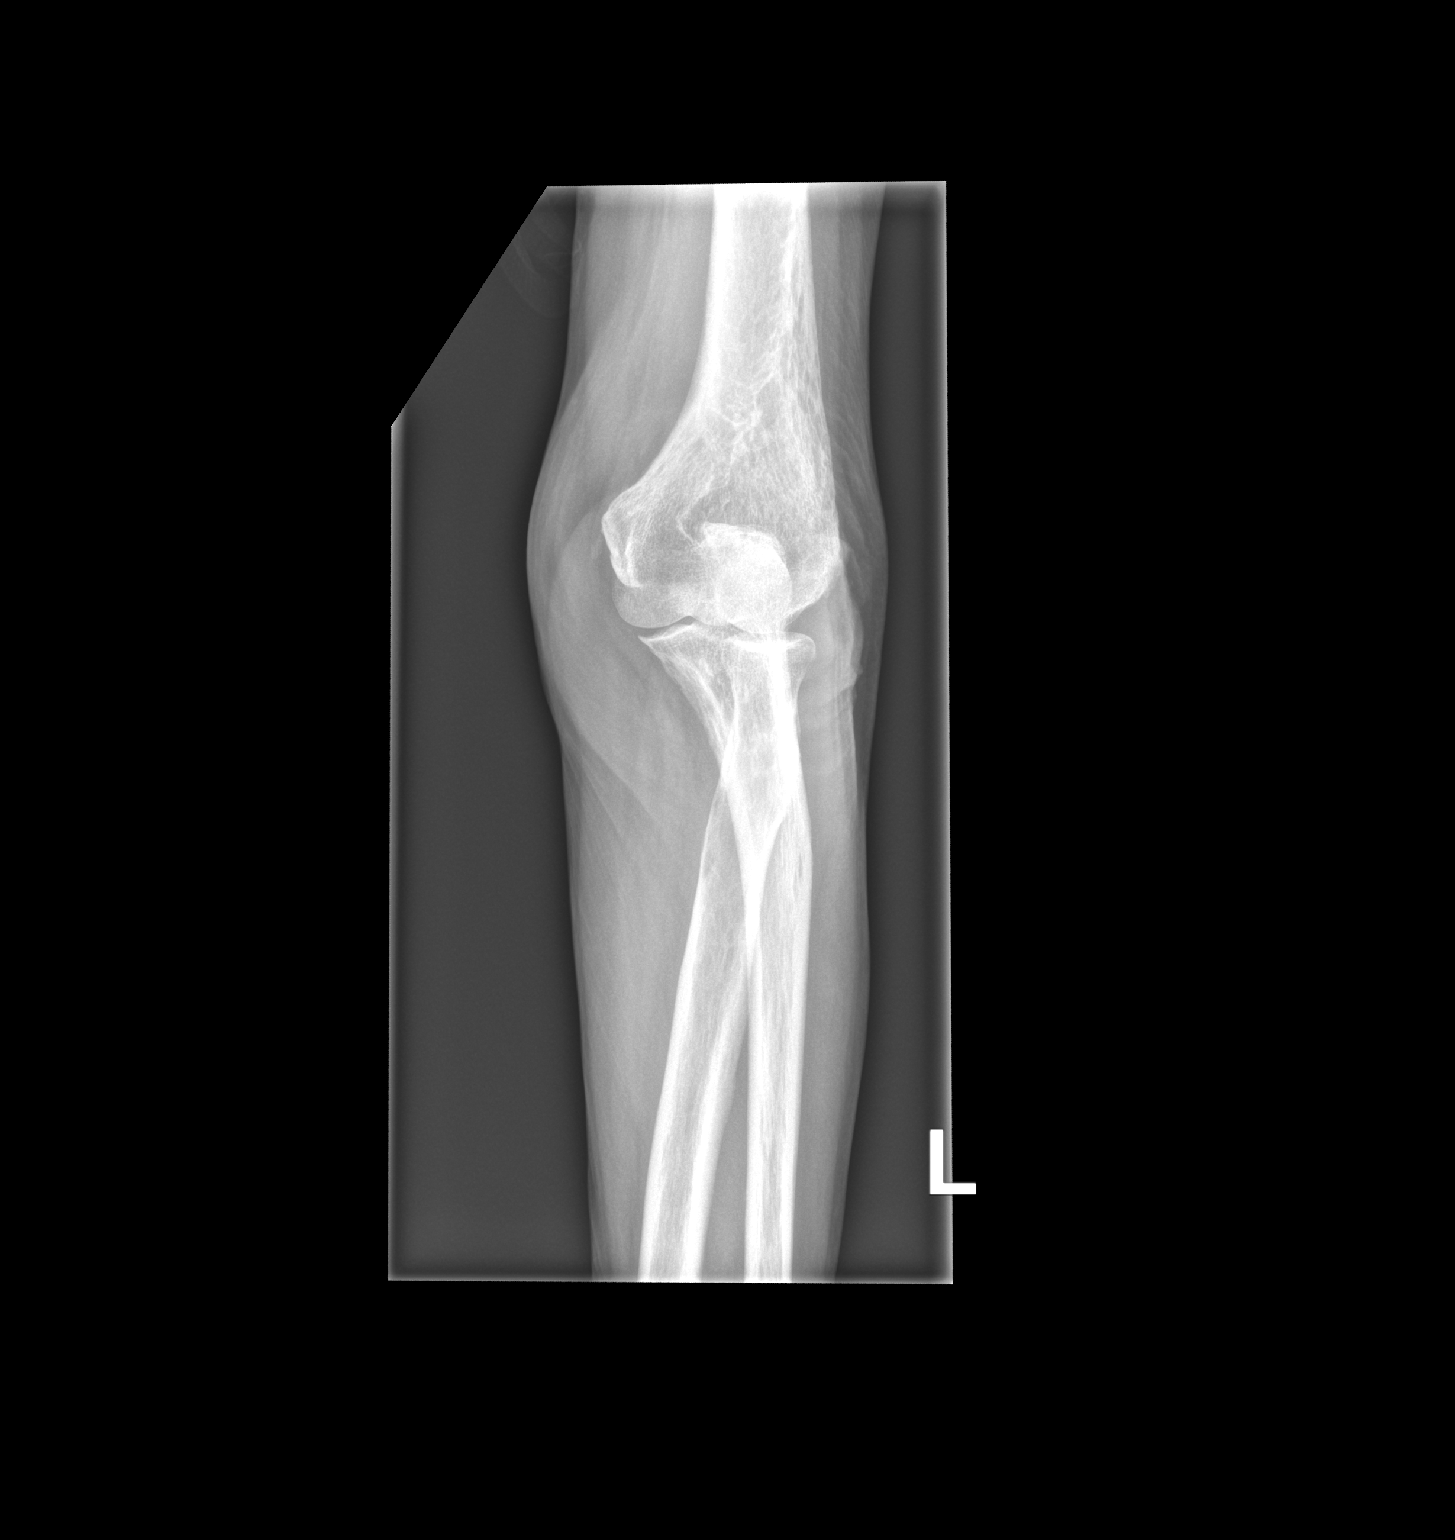

[x elbow lat left]
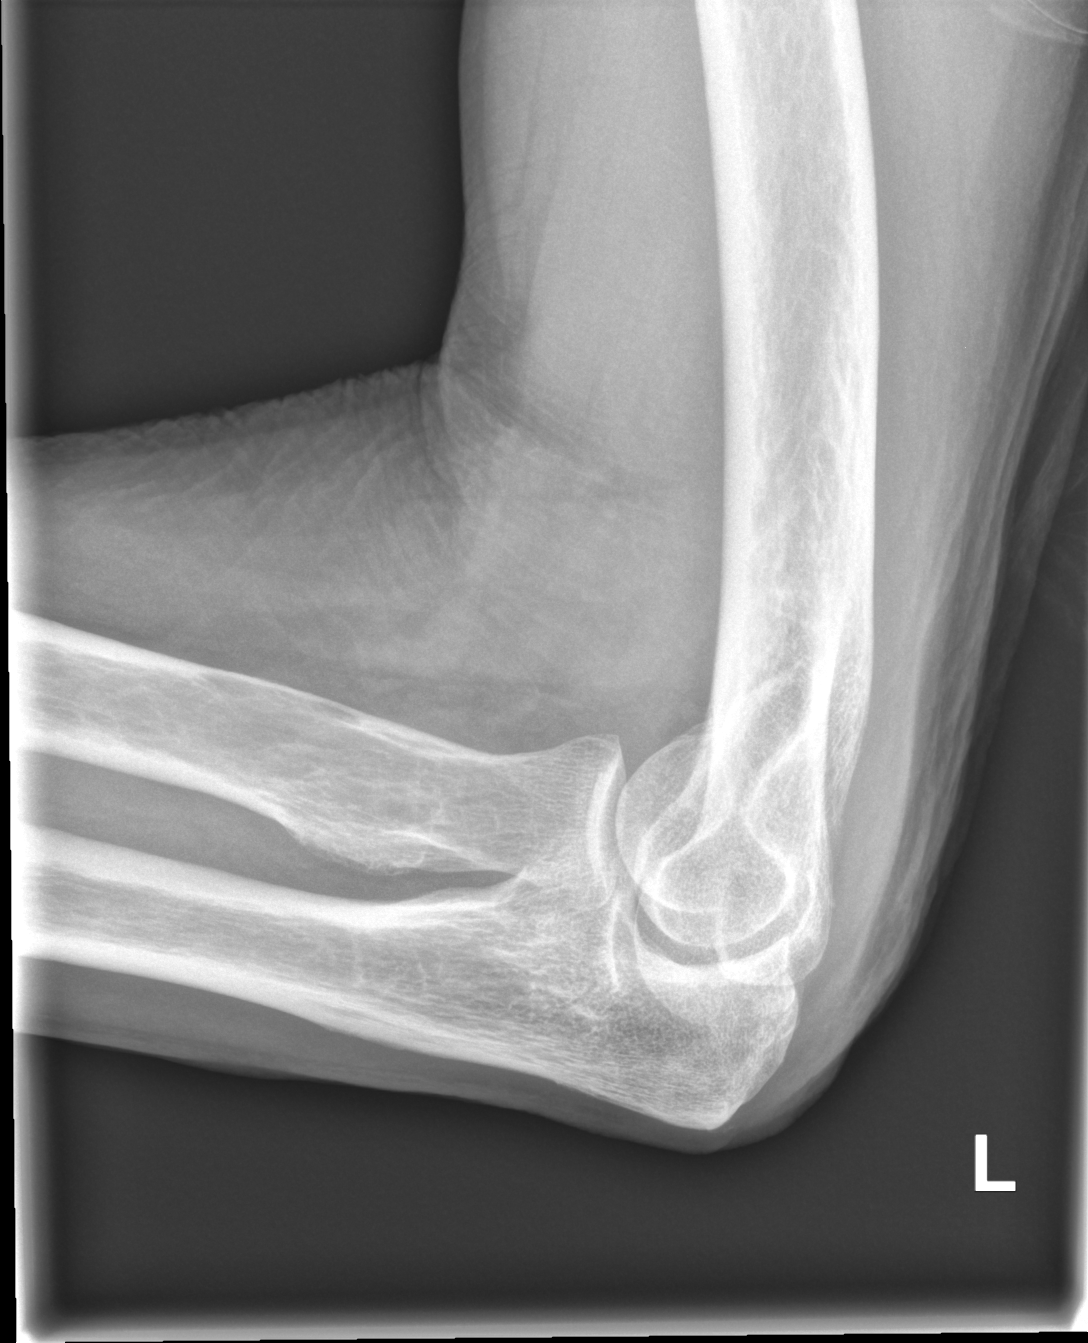

[3 of 3 positions shown; findings below may reference images not displayed]

EXAM

XR elbow LT min 3V

INDICATION

fall. pain
FALL [DATE], RT ELBOW PAIN.  AB

TECHNIQUE

XR elbow LT min 3V

COMPARISONS

None available

FINDINGS

No elbow joint effusion. No fracture or dislocation. Osseous demineralization.

IMPRESSION

No acute osseous findings.

Tech Notes:

FALL [DATE], RT ELBOW PAIN.  AB

## 2021-01-07 IMAGING — CT HEADWO
3 of 4 series · 14 of 47 positions shown, 16 images · non-contrast
Comparison: none

[Series 504: brain cor 5.00 hr40 s3 · coronal · 0.33mm/px · 3 of 41 slices shown]
[im 14/41  brain]
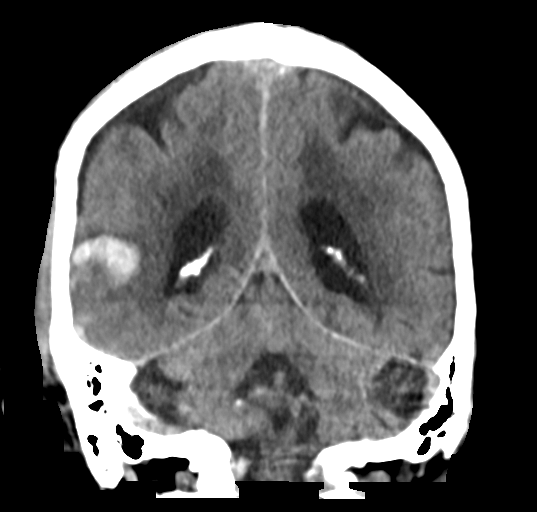
[im 18/41  brain]
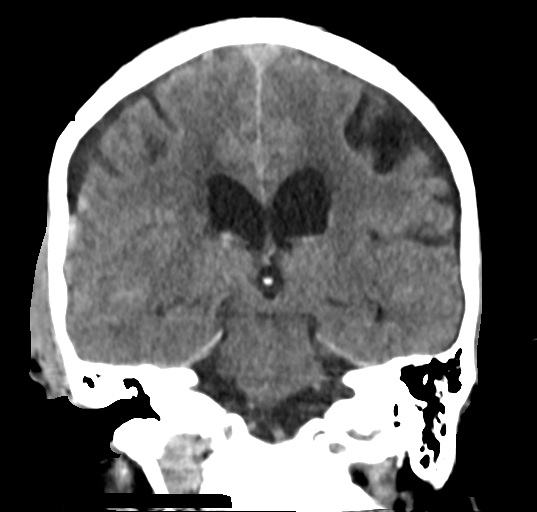
[im 23/41  brain]
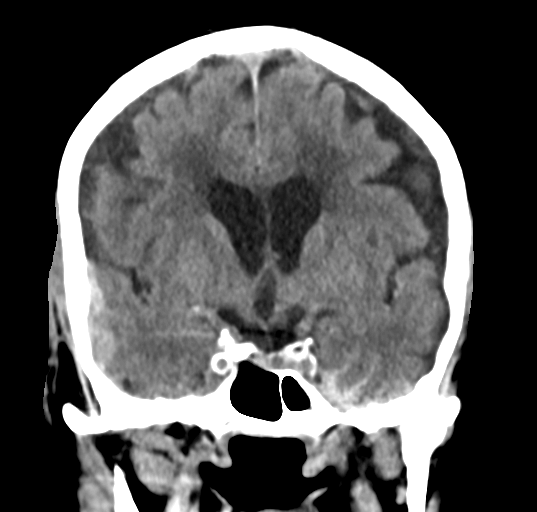

[Series 505: brain sag 5.00 hr40 s3 · sagittal · 0.33mm/px · 3 of 35 slices shown]
[im 12/35  brain]
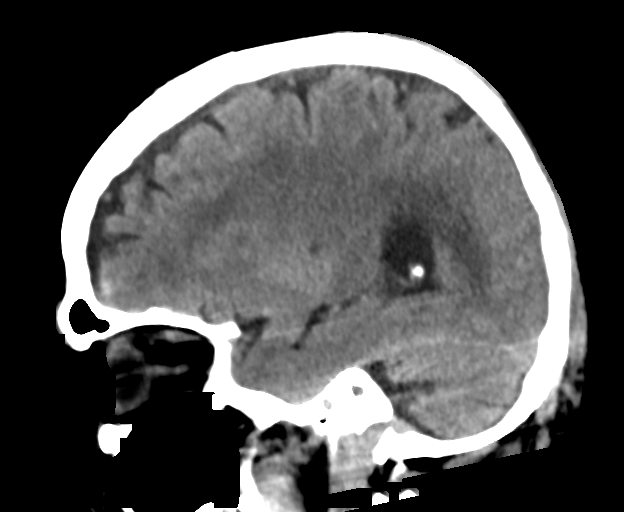
[im 18/35  brain]
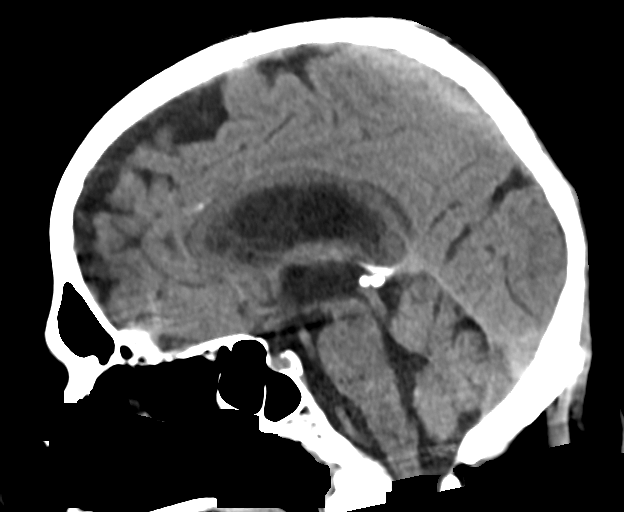
[im 23/35  brain]
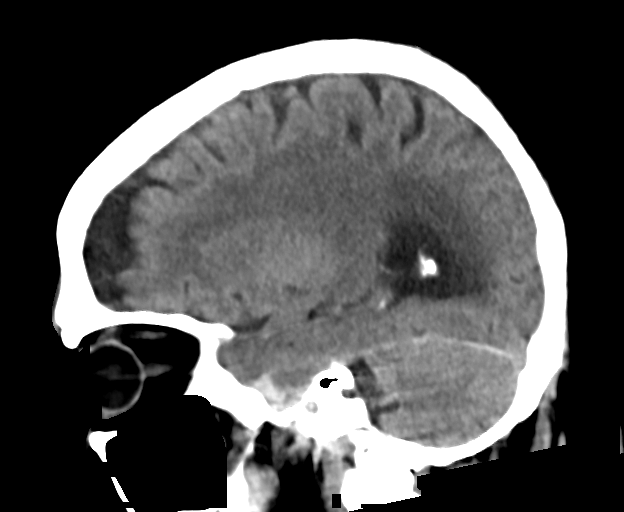

[Series 506: brain ax 2.00 hr60 s3 · axial · 0.35mm/px · z∈[-474,-341]mm · 8 of 84 slices shown, 10 images]
[im 8/84  brain]
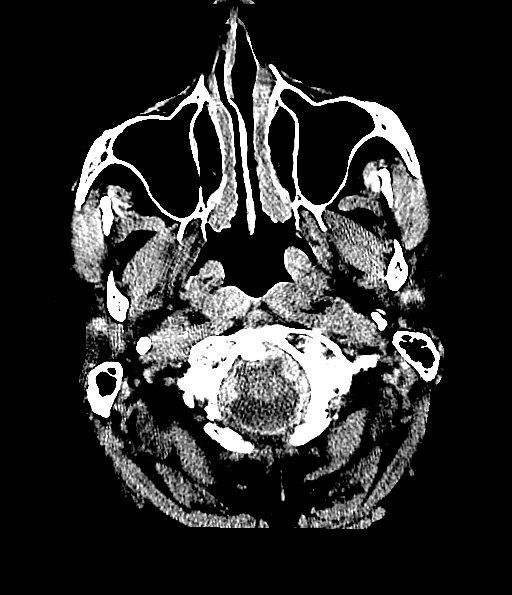
[im 8/84  bone]
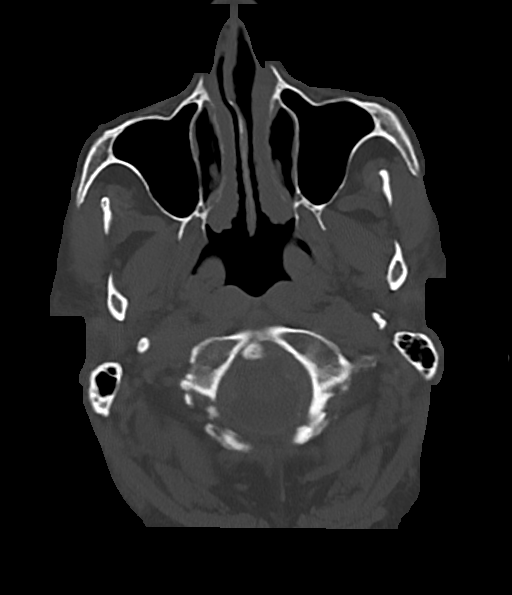
[im 16/84  brain]
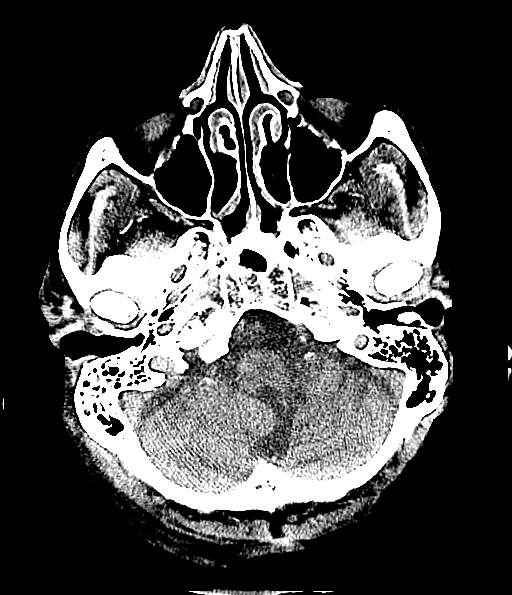
[im 28/84  brain]
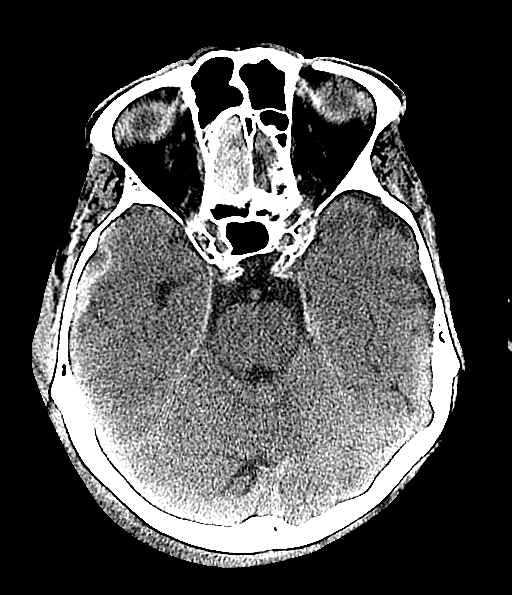
[im 36/84  brain]
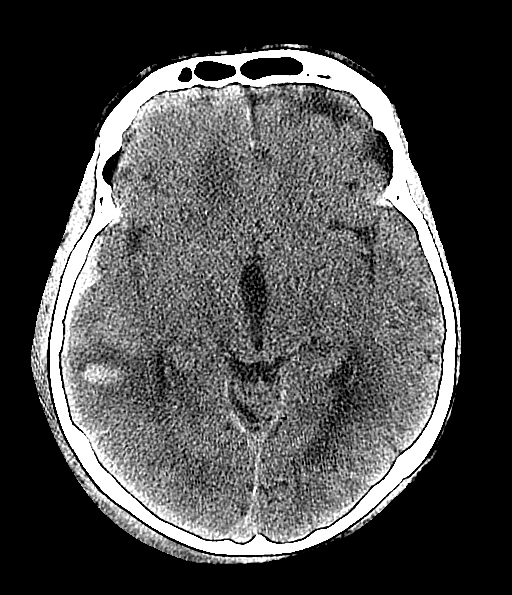
[im 48/84  brain]
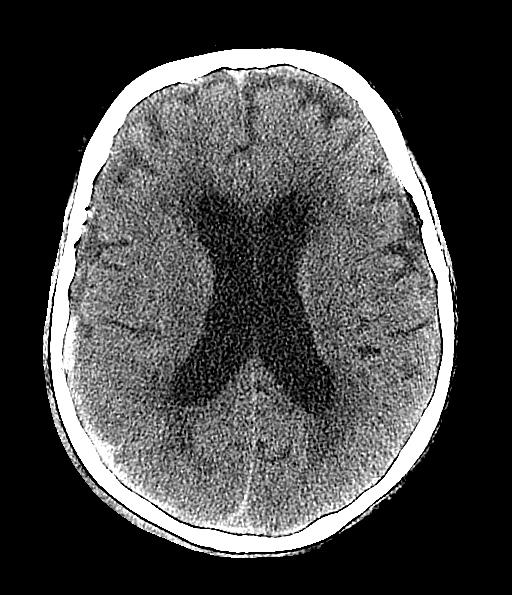
[im 48/84  bone]
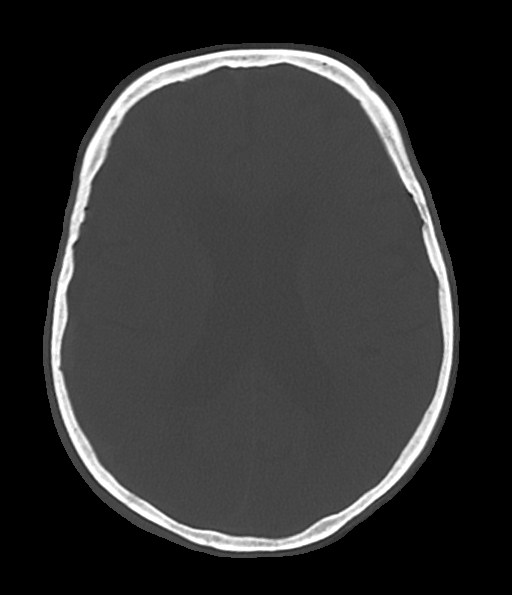
[im 56/84  brain]
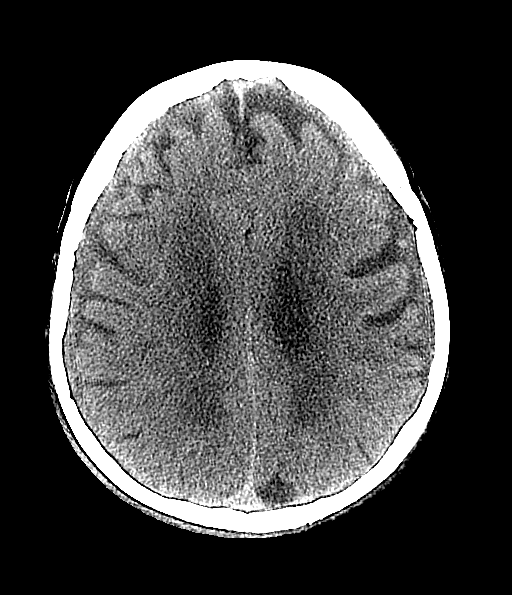
[im 68/84  brain]
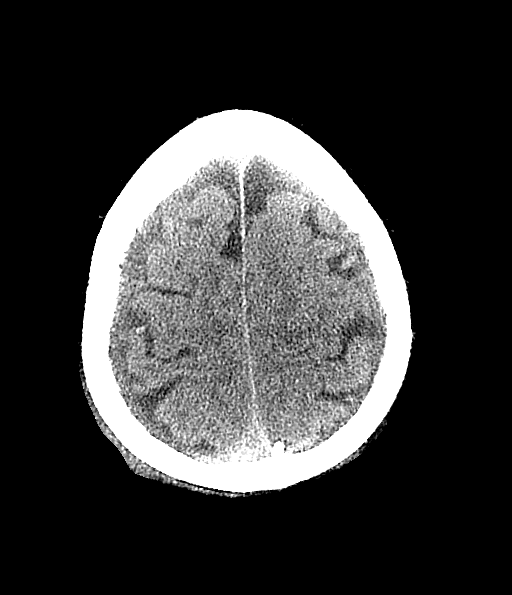
[im 76/84  brain]
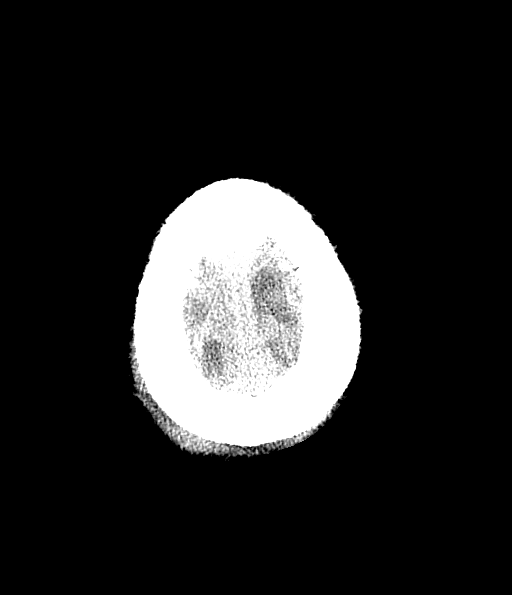

[14 of 47 positions shown; findings below may reference images not displayed]

DIAGNOSTIC STUDIES

EXAM

CT head/brain wo con

INDICATION

fall. ams
Fall [DATE].  Lt rib pain.  AB

TECHNIQUE

All CT scans at this facility use dose modulation, iterative reconstruction, and/or weight based
dosing when appropriate to reduce radiation dose to as low as reasonably achievable.

Number of previous computed tomography exams in the last 12 months is 1.

Number of previous nuclear medicine myocardial perfusion studies in the last 12 months is 0  .

COMPARISONS

None available.

FINDINGS

Intraparenchymal hematoma along the right posterior parietal lobe measuring up to 2.3 x 1.5 x
cm. There is adjacent mild subarachnoid hemorrhage. There is additionally a subdural fluid
collection with predominate low density suggestive of chronic collection, however with a small acute
component for example on series 504, image 21. Hemorrhage measures up to approximately 0.6 cm. No
midline shift or cerebral herniation. Chronic microvascular disease. No depressed calvarial
fracture. Mastoid air cells and paranasal sinuses are clear.

IMPRESSION

Intraparenchymal hematoma in the right posterior parietal lobe with mild adjacent subarachnoid
hemorrhage. In addition there is a small right subdural hemorrhage which is predominantly chronic
however with a small acute component. There is no midline shift or cerebral herniation at this time.

Findings called to Paulus N Ceejay by myself at [DATE] p.m.   01/07/2021.

Tech Notes:

Fall [DATE].  Lt rib pain.  AB

## 2021-01-07 MED ORDER — MAGNESIUM SULFATE IN WATER 4 GRAM/50 ML (8 %) IV PGBK
4 g | Freq: Once | INTRAVENOUS | 0 refills | Status: AC
Start: 2021-01-07 — End: ?
  Administered 2021-01-08: 04:00:00 4 g via INTRAVENOUS

## 2021-01-07 MED ORDER — ACETAMINOPHEN 500 MG PO TAB
1000 mg | ORAL | 0 refills | Status: AC
Start: 2021-01-07 — End: ?
  Administered 2021-01-08 – 2021-01-11 (×11): 1000 mg via ORAL

## 2021-01-07 MED ORDER — ACETAMINOPHEN 325 MG PO TAB
650 mg | ORAL | 0 refills | Status: DC | PRN
Start: 2021-01-07 — End: 2021-01-08

## 2021-01-07 MED ORDER — LIDOCAINE 5 % TP PTMD
1 | Freq: Every day | TOPICAL | 0 refills | Status: AC
Start: 2021-01-07 — End: ?
  Administered 2021-01-08 – 2021-01-16 (×8): 1 via TOPICAL

## 2021-01-07 MED ORDER — LEVETIRACETAM 500 MG PO TAB
500 mg | Freq: Two times a day (BID) | ORAL | 0 refills | Status: AC
Start: 2021-01-07 — End: ?
  Administered 2021-01-08 – 2021-01-11 (×8): 500 mg via ORAL

## 2021-01-07 MED ORDER — HYDROCHLOROTHIAZIDE 12.5 MG PO TAB
25 mg | Freq: Every day | ORAL | 0 refills | Status: AC
Start: 2021-01-07 — End: ?
  Administered 2021-01-08 (×2): 25 mg via ORAL

## 2021-01-07 MED ORDER — SODIUM PHOSPHATE IVPB
30 MMOL | Freq: Once | INTRAVENOUS | 0 refills | Status: DC
Start: 2021-01-07 — End: 2021-01-08

## 2021-01-07 MED ORDER — MELATONIN 5 MG PO TAB
5 mg | Freq: Every evening | ORAL | 0 refills | Status: AC | PRN
Start: 2021-01-07 — End: ?
  Administered 2021-01-10 – 2021-01-11 (×2): 5 mg via ORAL

## 2021-01-07 MED ORDER — SODIUM CHLORIDE 0.9 % IJ SOLN
50 mL | Freq: Once | INTRAVENOUS | 0 refills | Status: CP
Start: 2021-01-07 — End: ?
  Administered 2021-01-08: 01:00:00 50 mL via INTRAVENOUS

## 2021-01-07 MED ORDER — TAMSULOSIN 0.4 MG PO CAP
.4 mg | Freq: Every day | ORAL | 0 refills | Status: AC
Start: 2021-01-07 — End: ?
  Administered 2021-01-08 – 2021-01-16 (×9): 0.4 mg via ORAL

## 2021-01-07 MED ORDER — BUPIVACAINE 0.0625% 50ML PCA EPIDURAL SYRINGE
EPIDURAL | 0 refills | Status: AC
Start: 2021-01-07 — End: ?
  Administered 2021-01-08 – 2021-01-10 (×9): 50.000 mL via EPIDURAL

## 2021-01-07 MED ORDER — LIDOCAINE (PF) 10 MG/ML (1 %) IJ SOLN
EPIDURAL | 0 refills | Status: CP
Start: 2021-01-07 — End: ?
  Administered 2021-01-08: 05:00:00 5 mL via EPIDURAL

## 2021-01-07 MED ORDER — LIDOCAINE (PF) 10 MG/ML (1 %) IJ SOLN
INTRAMUSCULAR | 0 refills | Status: CP
Start: 2021-01-07 — End: ?
  Administered 2021-01-08: 05:00:00 3 mL via INTRAMUSCULAR

## 2021-01-07 MED ORDER — LIDOCAINE-EPINEPHRINE (PF) 1.5 %-1:200,000 IJ SOLN (OR)
0 refills | Status: CP
Start: 2021-01-07 — End: ?

## 2021-01-07 MED ORDER — NALOXONE 0.4 MG/ML IJ SOLN
.08 mg | INTRAVENOUS | 0 refills | Status: AC | PRN
Start: 2021-01-07 — End: ?

## 2021-01-07 MED ORDER — IOHEXOL 350 MG IODINE/ML IV SOLN
100 mL | Freq: Once | INTRAVENOUS | 0 refills | Status: CP
Start: 2021-01-07 — End: ?
  Administered 2021-01-08: 01:00:00 100 mL via INTRAVENOUS

## 2021-01-07 MED ORDER — FENTANYL CITRATE (PF) 50 MCG/ML IJ SOLN
25-50 ug | INTRAVENOUS | 0 refills | Status: AC | PRN
Start: 2021-01-07 — End: ?
  Administered 2021-01-08: 50 ug via INTRAVENOUS

## 2021-01-07 MED ORDER — OXYCODONE 5 MG PO TAB
5-10 mg | ORAL | 0 refills | Status: AC | PRN
Start: 2021-01-07 — End: ?
  Administered 2021-01-09: 21:00:00 5 mg via ORAL
  Administered 2021-01-10: 02:00:00 10 mg via ORAL
  Administered 2021-01-11 (×2): 5 mg via ORAL

## 2021-01-07 MED ORDER — SPIRONOLACTONE 25 MG PO TAB
25 mg | Freq: Every day | ORAL | 0 refills | Status: AC
Start: 2021-01-07 — End: ?
  Administered 2021-01-08 – 2021-01-16 (×9): 25 mg via ORAL

## 2021-01-07 MED ORDER — SODIUM PHOSPHATE IVPB
30 MMOL | Freq: Once | INTRAVENOUS | 0 refills | Status: AC
Start: 2021-01-07 — End: ?
  Administered 2021-01-08 (×2): 30 mmol via INTRAVENOUS

## 2021-01-07 MED ORDER — NEBIVOLOL 10 MG PO TAB
20 mg | Freq: Every day | ORAL | 0 refills | Status: DC
Start: 2021-01-07 — End: 2021-01-08

## 2021-01-07 MED ORDER — METOPROLOL SUCCINATE 50 MG PO TB24
50 mg | Freq: Every day | ORAL | 0 refills | Status: AC
Start: 2021-01-07 — End: ?
  Administered 2021-01-08 – 2021-01-15 (×6): 50 mg via ORAL

## 2021-01-07 MED ORDER — ONDANSETRON HCL (PF) 4 MG/2 ML IJ SOLN
4 mg | INTRAVENOUS | 0 refills | Status: AC | PRN
Start: 2021-01-07 — End: ?

## 2021-01-07 MED ORDER — AMLODIPINE 10 MG PO TAB
10 mg | Freq: Every day | ORAL | 0 refills | Status: AC
Start: 2021-01-07 — End: ?
  Administered 2021-01-08 – 2021-01-16 (×9): 10 mg via ORAL

## 2021-01-07 MED ORDER — ONDANSETRON 4 MG PO TBDI
4 mg | ORAL | 0 refills | Status: AC | PRN
Start: 2021-01-07 — End: ?

## 2021-01-08 ENCOUNTER — Inpatient Hospital Stay: Admit: 2021-01-08 | Discharge: 2021-01-08 | Payer: MEDICARE

## 2021-01-08 ENCOUNTER — Encounter: Admit: 2021-01-08 | Discharge: 2021-01-08 | Payer: MEDICARE

## 2021-01-08 MED ADMIN — FENTANYL CITRATE (PF) 50 MCG/ML IJ SOLN [3037]: 50 ug | INTRAVENOUS | @ 04:00:00 | Stop: 2021-01-08 | NDC 00641602701

## 2021-01-08 NOTE — Progress Notes
Cervical Spine Clearance    Radiology reviewed personally    Patient alert, oriented, and cooperative    No gross motor or sensory deficits in bilateral upper and lower extremities    No cervical spine tenderness to palpation    Collar removed, and there was no cervical spine pain with active range of motion    C-spine cleared clinically    Florenda Watt, MD  Pager #[phone]

## 2021-01-08 NOTE — Consults
Anesthesia Consult Note  01/07/2021     Patient: Tyler BOWLING Ashley  MRN: [id number]    Admission Date:  01/07/2021, LOS: 0 days  Admission Diagnosis: SAH (subarachnoid hemorrhage) (HCC) [I60.9]    ASSESSMENT: 87 y.o. male with PMH of HTN and smoking who was transferred from an outside facility following mechanical fall. Acute pain team was consulted for assistance with managing pain a/w L 7-9th rib fractures. Patient would benefit from interventional pain management.     - Discussed risks associated with uncontrolled pain from rib fractures, including but not limited to pneumonia and atelectasis/pneumothorax. Discussed options for interventional pain control including regional and neuraxial anesthesia, as well as medical management options.  - Patient is agreeable to thoracic epidural. Will plan on placing epidural in SICU this evening.  - Multimodal analgesia including:  - Agree with PRN fentanyl for breakthrough pain ordered by primary team.  - Additional medications added/adjusted by our team now including: scheduled tylenol q6h, oxycodone 5-10mg  q4h PRN, and lidocaine patch.  - Patient given incentive spirometer and encouraged frequent use.    Please reach out if further questions arise. Anesthesia acute pain team will continue to follow.     Patient seen and discussed with Dr. Earney Hamburg.    Nile Dear, Ashley   Anesthesiology CA-1/PGY-2  Anesthesia Acute Pain 5050  _______________________________________________________________________    HPI: Tyler Ashley is a 87 y.o. male with PMH of HTN and smoking who was transferred from an outside facility following a fall. He was found to have IPH and SDH, as well as multiple fractures 2/2 fall. Anesthesia acute pain team was consulted for left-sided early subacute nondisplaced 7th-9th rib fractures. Pt reports pain rated at 10/10 in his lower ribs, described as sharp/stabbing in character with respirations, and has received 50 mcg of fentanyl for pain control thus far since arrival. During evaluation, he was on room air. Breath sounds were diminished throughout and respiratory effort overall appeared shallow. On incentive spirometry, he was able to demonstrate breaths of ~900cc. Pt does not use inhalers at home but endorses possible COPD. He is not on anticoagulation, and platelet level was 152 on admission.    (During evaluation, he was oriented to self, event, and time, but not location- reports he was in his [employer]).       CT Chest (01/07/2021):  IMPRESSION  Trace right pneumothorax. No tension pneumothorax.    Multiple old, healed rib fractures. There are early subacute appearing nondisplaced fractures of the left ribs 7, 8, and 9.    Dependent atelectasis. Patchy mild centrilobular ground-glass nodular opacities greater in the right upper lobe, likely infectious or inflammatory. Mild emphysema.      Medical History:   Diagnosis Date   ? Disorganized thinking    ? Hypertension    ? Neuropathy    ? Squamous cell carcinoma      No past surgical history on file.  Medications:  No current facility-administered medications on file prior to encounter.     Current Outpatient Medications on File Prior to Encounter   Medication Sig Dispense Refill   ? acitretin (SORIATANE) 25 mg cap Take 1 Cap by mouth daily with dinner. 30 Cap 2   ? amLODIPine (NORVASC) 10 mg tablet Take 10 mg by mouth daily.       ? calcium carbonate (OS-CAL) 1250 mg tablet Take 1,250 mg by mouth daily.       ? cholecalciferol (vitamin D3) (VITAMIN D3) 1,000 units tablet Take 5,000  Units by mouth daily.       ? doxepin (SINEQUAN) 10 mg capsule Take 10 mg up to 50 mg nightly as needed for itching 30 Cap 3   ? folic acid (FOLVITE) 1 mg tablet Daily     ? hydrochlorothiazide (HYDRODIURIL) 25 mg tablet Take 25 mg by mouth daily.       ? lisinopriL (ZESTRIL) 40 mg tablet Take 40 mg by mouth daily.       ? metoprolol succinate XL (TOPROL XL) 100 mg extended release tablet 50 mg.     ? MULTIVITAMIN PO Take  by mouth. ? nebivoloL (BYSTOLIC) 20 mg tablet Take 20 mg by mouth daily.       ? other medication 1 Dose. Tumeric        ? potassium chloride SR (K-DUR) 20 mEq tablet Take 20 mEq by mouth daily.       ? spironolactone (ALDACTONE) 25 mg tablet Take 25 mg by mouth daily.     ? tamsulosin (FLOMAX) 0.4 mg capsule      ? telmisartan (MICARDIS) 80 mg tablet Take 80 mg by mouth daily.         Allergies:  Penicillins, Clavulanic acid, Aspirin, and Loratadine    Social History     Socioeconomic History   ? Marital status: Married   Tobacco Use   ? Smoking status: Current Every Day Smoker     Packs/day: 0.50     Years: 50.00     Pack years: 25.00     Types: Cigarettes   ? Smokeless tobacco: Never Used   Substance and Sexual Activity   ? Alcohol use: Yes     Comment: socail   ? Drug use: No     Family History   Problem Relation Age of Onset   ? Stroke Mother    ? Motor Neuron Disease Father      Vitals:  Vital Signs: Last Filed In 24 Hours Vital Signs: 24 Hour Range   BP: 171/80 (11/12 2100)  Temp: 36.6 ?C (97.9 ?F) (11/12 2000)  Pulse: 92 (11/12 2100)  Respirations: 13 PER MINUTE (11/12 2100)  SpO2: 96 % (11/12 2100)  O2 Device: None (Room air) (11/12 1800)  Height: 177.8 cm (5' 10) (11/12 1740) BP: (142-171)/(79-98)   Temp:  [36.6 ?C (97.9 ?F)-36.8 ?C (98.2 ?F)]   Pulse:  [92-112]   Respirations:  [13 PER MINUTE-22 PER MINUTE]   SpO2:  [95 %-97 %]   O2 Device: None (Room air)   Intensity Pain Scale (Self Report): 5 (01/07/21 1833)        Physical Exam  General: Restless, appears in moderate discomfort  Pulm: Breath sounds diminished throughout, shallow respirations. No wheezes. On room air  CV: Regular rate and rhythm  Neuro: Oriented to self, time, and event; not oriented to place  Extremities: Spontaneous movement of all four extremities  Skin: Warm and dry  Psych: Appropriate affect, insight, and judgement      ROS:  A complete 12 point ROS was obtained and was negative except for those listed in HPI.    Lab/Radiology/Other Diagnostic Tests:  Recent Labs     01/07/21  1750 01/07/21  1921   HGB 13.3*  --    HCT 39.3*  --    WBC 13.1*  --    PLTCT 152  --    NA 134*  --    K 4.4  --    CL 101  --  CO2 23  --    BUN 13  --    CR 0.59  --    GLU 112*  --    CA 10.1  --    MG 1.7  --    PO4 2.1  --    ALBUMIN 4.3  --    TOTPROT 6.9  --    TOTBILI 1.0  --    AST 20  --    ALT 12  --    ALKPHOS 87  --    INR  --  1.0   PT  --  11.4   PTT  --  30.7     Glucose: (!) 112 (01/07/21 1750)

## 2021-01-08 NOTE — Consults
Neurosurgery Trauma Consult History and Physical Note      Admission Date: 01/07/2021                                                LOS: 0 days    Reason for Consult:  Right temporoparietal IPH with tSAH and right convexity SDH (IPH score of 1 for age above 52)     Consult type: Opinion with orders    Consulting Physician: Neurosurgery Attendings: Georgia Lopes, Ashley    Requesting Physician: SICU     Assessment/Plan:  Tyler Ashley is a 87 y.o. male who presents following a fall and is found to have a right temporoparietal IPH with right convexity SDH     - No acute neurosurgical interventions  - Maintain INR < 1.4, Platelets > 80k  - Maintain SBP < 140  - Avoid anticoagulation  - Recommend minimizing sedation where possible. Avoid opioid and benzodiazepine drips   - Maintain HOB >30 degrees when stable from spine and medical standpoint. Defer to primary  - Maintain normonatremia  - Neurochecks Q1H  - Keppra 500mg  BID for seizure ppx in the setting of SAH for 7 days   - Recommend repeat 8 hr stability CT scan   - Recommend AM MRI head w/wo contrast   - Will staff patient with Dr. Earl Gala in the AM   ____________________________________________________________________    History of Present Illness:   Tyler Ashley is a 87 y.o.  male male who presents as a transfer from [facility] where he was initially seen following a fall at home. Patient suffered several injuries as a result of the fall including LLE tibial fx, left sided rib fractures, and a right temporoparietal IPH with right convexity SDH for which we are consulted. The patient is a retired Development worker, community and reports that the initial fall happened roughly two days ago and he had another fall today. History is a little murky regarding circumstances of the fall. Unsure if he hit his head or had any positive LOC. The patient's main concern at this time his significant pain that he is having as a result of his rib and lower extremity fractures. He does not endorse any significant headache at this time.     Past Medical History:  Medical History:   Diagnosis Date   ? Disorganized thinking    ? Hypertension    ? Neuropathy    ? Squamous cell carcinoma        Past Surgical History:  No past surgical history on file.    Social History:  Social History     Tobacco Use   ? Smoking status: Current Every Day Smoker     Packs/day: 0.50     Years: 50.00     Pack years: 25.00     Types: Cigarettes   ? Smokeless tobacco: Never Used   Substance Use Topics   ? Alcohol use: Yes     Comment: socail   ? Drug use: No       Family History:  Family History   Problem Relation Age of Onset   ? Stroke Mother    ? Motor Neuron Disease Father        Allergies:    Penicillins, Clavulanic acid, Aspirin, and Loratadine    Medications:  PTA:  Medications Prior to Admission  Medication Sig   ? acitretin (SORIATANE) 25 mg cap Take 1 Cap by mouth daily with dinner.   ? amLODIPine (NORVASC) 10 mg tablet Take 10 mg by mouth daily.     ? calcium carbonate (OS-CAL) 1250 mg tablet Take 1,250 mg by mouth daily.     ? cholecalciferol (vitamin D3) (VITAMIN D3) 1,000 units tablet Take 5,000 Units by mouth daily.     ? doxepin (SINEQUAN) 10 mg capsule Take 10 mg up to 50 mg nightly as needed for itching   ? folic acid (FOLVITE) 1 mg tablet Daily   ? hydrochlorothiazide (HYDRODIURIL) 25 mg tablet Take 25 mg by mouth daily.     ? lisinopriL (ZESTRIL) 40 mg tablet Take 40 mg by mouth daily.     ? metoprolol succinate XL (TOPROL XL) 100 mg extended release tablet 50 mg.   ? MULTIVITAMIN PO Take  by mouth.     ? nebivoloL (BYSTOLIC) 20 mg tablet Take 20 mg by mouth daily.     ? other medication 1 Dose. Tumeric      ? potassium chloride SR (K-DUR) 20 mEq tablet Take 20 mEq by mouth daily.     ? spironolactone (ALDACTONE) 25 mg tablet Take 25 mg by mouth daily.   ? tamsulosin (FLOMAX) 0.4 mg capsule    ? telmisartan (MICARDIS) 80 mg tablet Take 80 mg by mouth daily.         Inpatient:  Scheduled Meds:[START ON 01/08/2021] amLODIPine (NORVASC) tablet 10 mg, 10 mg, Oral, QDAY  FENTANYL CITRATE (PF) 50 MCG/ML IJ SOLN (Cabinet Override), , , NOW  [START ON 01/08/2021] hydroCHLOROthiazide tablet 25 mg, 25 mg, Oral, QDAY  levETIRAcetam (KEPPRA) tablet 500 mg, 500 mg, Oral, BID  [START ON 01/08/2021] metoprolol succinate XL (TOPROL XL) tablet 50 mg, 50 mg, Oral, QDAY  [START ON 01/08/2021] nebivoloL (BYSTOLIC) tablet 20 mg, 20 mg, Oral, QDAY  [START ON 01/08/2021] spironolactone (ALDACTONE) tablet 25 mg, 25 mg, Oral, QDAY  [START ON 01/08/2021] tamsulosin (FLOMAX) capsule 0.4 mg, 0.4 mg, Oral, QDAY after breakfast    Continuous Infusions:  PRN and Respiratory Meds:acetaminophen Q6H PRN, fentaNYL citrate PF Q1H PRN, melatonin QHS PRN, ondansetron Q6H PRN **OR** ondansetron (ZOFRAN) IV Q6H PRN      Review of Systems:  Full 10 point review of systems negative except for HPI    Physical Exam:  Vital Signs:  Last Filed in 24 hours Vital Signs:  24 hour Range    BP: 160/98 (11/12 1800)  Temp: 36.8 ?C (98.2 ?F) (11/12 1740)  Pulse: 112 (11/12 1800)  Respirations: 22 PER MINUTE (11/12 1800)  SpO2: 97 % (11/12 1800)  O2 Device: None (Room air) (11/12 1800)  Height: 177.8 cm (5' 10) (11/12 1740) BP: (142-160)/(79-98)   Temp:  [36.8 ?C (98.2 ?F)]   Pulse:  [103-112]   Respirations:  [20 PER MINUTE-22 PER MINUTE]   SpO2:  [96 %-97 %]   O2 Device: None (Room air)     General Appearance: No acute distress  Lungs: Good air movement and equal chest rise bilaterally  Heart: Regular rate and rhythm  Abdomen: Soft, non-tender, no masses,  no organomegaly  Extremities: No edema, redness or tenderness in the calves or thighs    Neurologic Exam:   Mental Status: Awake, alert and oriented x 4,  Pupils: Pupils equal round and reactive to light  Cranial Nerves: CN II-XII individually tested and found to be intact, gag not tested  Motor:  Patient follows  commands in all four, he moves his right side spontaneously, his left side has less movement on account of pain. He is guarding movment in his LUE due to the rib fractures and his LLE due to his tibial fx. When the patient is asked to push through pain he does have full strength throughout   Sensation is intact to fine touch throughout     LabTests:  Hematology:    Lab Results   Component Value Date    HGB 13.3 01/07/2021    HCT 39.3 01/07/2021    PLTCT 152 01/07/2021    WBC 13.1 01/07/2021    MCV 97.7 01/07/2021    MCHC 33.8 01/07/2021    MPV 9.4 01/07/2021    RDW 13.0 01/07/2021     General Chemistry:   Lab Results   Component Value Date    NA 134 01/07/2021    K 4.4 01/07/2021    CL 101 01/07/2021    CO2 23 01/07/2021    BUN 13 01/07/2021    CR 0.59 01/07/2021    GLU 112 01/07/2021    OBSCA 1.20 01/07/2021    CA 10.1 01/07/2021    MG 1.7 01/07/2021    PO4 2.1 01/07/2021     General Chemistry:   Lab Results   Component Value Date    GAP 10 01/07/2021    ALBUMIN 4.3 01/07/2021    LACTIC 1.8 01/07/2021    TOTBILI 1.0 01/07/2021    TOTPROT 6.9 01/07/2021    AST 20 01/07/2021    ALT 12 01/07/2021    ALKPHOS 87 01/07/2021             Fidel Levy, Ashley

## 2021-01-08 NOTE — Progress Notes
Patient arrived to [id number] via EMS cart sitting up with no c-collar in place. Patient placed in c-collar and flattened to transfer to ICU bed. Assessment completed (see doc flowsheet) and outside foley replaced. Patient oriented x4, restless, asking to get out of bed and where "Velna Hatchet" is. Only belongings that arrived with patient are a pair of white socks.

## 2021-01-08 NOTE — H&P (View-Only)
Admission History and Physical Examination    Tyler VANDONGEN Md  Admission Date:  01/07/2021                     __________________________________________________________________________________  HPI: Tyler TINNES Md is a 87 y.o. year old male transferred from outside facility after he was admitted following a fall he sustained on 01/05/21. He doesn't remember much of the events leading up to the fall but says that he did not lose consciousness and he mixed up his hand for his foot, leading to the fall. He denies shortness of breath or chest pain leading up to the fall. Currently, he complains of pain all over his head and on the left side of his chest. He underwent CTH and CT chest at transferring facility and was found to have subacute appearing L 7-9 rib fx, multiple healed chronic rib fx, and a right hemispheric IPH, SDH, SAH. Patient is a Radiation protection practitioner medicine physician. He came with neuro intact and GCS 15 with some questionable weakness of LLE.    Denies use of blood thinners.    PMH:    Medical History:   Diagnosis Date   ? Disorganized thinking    ? Hypertension    ? Neuropathy    ? Squamous cell carcinoma      PSH:   Hx of open appendectomy many years ago  No past surgical history on file.   Meds:   No current facility-administered medications on file prior to encounter.     Current Outpatient Medications on File Prior to Encounter   Medication Sig Dispense Refill   ? acitretin (SORIATANE) 25 mg cap Take 1 Cap by mouth daily with dinner. 30 Cap 2   ? amLODIPine (NORVASC) 10 mg tablet Take 10 mg by mouth daily.       ? calcium carbonate (OS-CAL) 1250 mg tablet Take 1,250 mg by mouth daily.       ? cholecalciferol (vitamin D3) (VITAMIN D3) 1,000 units tablet Take 5,000 Units by mouth daily.       ? doxepin (SINEQUAN) 10 mg capsule Take 10 mg up to 50 mg nightly as needed for itching 30 Cap 3   ? folic acid (FOLVITE) 1 mg tablet Daily     ? hydrochlorothiazide (HYDRODIURIL) 25 mg tablet Take 25 mg by mouth daily.       ? lisinopriL (ZESTRIL) 40 mg tablet Take 40 mg by mouth daily.       ? metoprolol succinate XL (TOPROL XL) 100 mg extended release tablet 50 mg.     ? MULTIVITAMIN PO Take  by mouth.       ? nebivoloL (BYSTOLIC) 20 mg tablet Take 20 mg by mouth daily.       ? other medication 1 Dose. Tumeric        ? potassium chloride SR (K-DUR) 20 mEq tablet Take 20 mEq by mouth daily.       ? spironolactone (ALDACTONE) 25 mg tablet Take 25 mg by mouth daily.     ? tamsulosin (FLOMAX) 0.4 mg capsule      ? telmisartan (MICARDIS) 80 mg tablet Take 80 mg by mouth daily.         Allergies:   Penicillins, Clavulanic acid, Aspirin, and Loratadine  SH:   Social History     Socioeconomic History   ? Marital status: Married   Tobacco Use   ? Smoking status: Current Every Day Smoker  Packs/day: 0.50     Years: 50.00     Pack years: 25.00     Types: Cigarettes   ? Smokeless tobacco: Never Used   Substance and Sexual Activity   ? Alcohol use: Yes     Comment: socail   ? Drug use: No       FH:  No family history of bleeding disorders.   Family History   Problem Relation Age of Onset   ? Stroke Mother    ? Motor Neuron Disease Father      ROS:   12 point Review of Systems negative unless mentioned in HPI.    PE:   Primary survey:   Airway patent to voice, trachea midline   Breath sounds equal bilaterally, equal chest rise   Carotid, radial, femoral palpable bilaterally, heart sounds clear  GCS 15, 5/5 strength/sensation in bilateral upper and R lower extremities. LLE with 4/5 strength    Secondary survey:   HEENT: Pupils 3mm bilat, round and reactive to light, no skull/maxillofacial bony deformities or TTP, no scalp lacs/abrasions, no otorrhea/rhinorrhea, intact tympanic membranes bilaterally  CHEST: no clavicular, sternal or thoracic TTP/bony deformities, bilateral axillae clear   ABD: s/nt/nd   BACK: no C,T,L,S spine TTP or step-off deformities, bilateral flanks clear  PELVIS: no obvious instability, perineum clear, normal rectal tone with no gross blood   EXT: no obvious long bone deformities, abrasions or lacs to bilateral upper and lower extremities  Bruising diffusely around left ankle. Regular RoM, nontender.    A/P: Tyler Pearson Md is a 87 y.o. year old male w/ CAD, HTN, HL, BPH, gait instability s/p fall w/ R post parietal IPH, mild adjacent SAH, small R SDH, T bone fx; R trace PTX, subacute L 7-9 rib fx    -CT c-spine, abdomen, pelvis, CT head reviewed with NRSG, plan for 8 hour scan at 2230. CT max/face at that time given temporal bone fx  - C spine precautions  - Q1h neuro checks, Keppra 500 mg BID x 7 days  - MRI head in AM per neurosurgery to evaluate for mass as cause of bleeding  - LLE ankle plain film  - Consult internal medicine - geriatric trauma given age, appreciate recs  - Consulte Transitions of care geriatric, given age  - Admit to surgical ICU  - D/W Dr. Murvin Donning, MD   Pager [phone]

## 2021-01-08 NOTE — Anesthesia Procedure Notes
Anesthesia Procedure: Epidural Block    EPIDURAL BLOCK    Date/Time: 01/07/2021 10:35 PM    Patient location: ICU  Reason for block: post-op pain management      Preprocedure checklist performed: 2 patient identifiers, risks & benefits discussed, patient evaluated, timeout performed, consent obtained, patient being monitored, existing labs reviewed, no anticoagulant within risk period and sterile drape    Sterile technique:  - Proper hand washing  - Cap, mask  - Sterile gloves  - Skin prep for antisepsis      Epidural Procedure  Patient position: right lateral decubitus  Prep: ChloraPrep    Monitoring: BP, EKG and continuous pulse ox  Approach: right paramedian  Location: thoracic  Level/Interspace: T7-8  Injection technique: LOR saline  Procedures: landmark technique      Number of attempts: 2    Needle/epidural catheter:      Needle type: Tuohy     Needle gauge: 17 G     Needle length: 3.5 in     Needle insertion depth: 6 cm     Catheter type: wire reinforced and multi orifice     Catheter size: 19 G     Catheter at skin depth: 11 cm    Procedure Outcome  Events: negative test dose, no paresthesia and negative aspiration test    Patient tolerance of procedure: patient tolerated the procedure well with no immediate complications}    Procedure Medications    Local Anesthesia: lidocaine PF 1% (10 mg/mL) - Injection   3 mL - 01/07/2021 10:35:00 PM  Test Dose: lidocaine 1.5%/EPINEPHrine 1:200,000 1.5 %-1:200,000 - SEE ADMIN INSTRUCTIONS   3 mL - 01/07/2021 10:35:00 PM  Bolus Dose: lidocaine PF 1% (10 mg/mL) - Epidural   5 mL - 01/07/2021 10:35:00 PM      Refer to nursing documentation for vitals and monitoring data during procedure.    Performed by: Gena Fray, DO  Authorized by: Gaynelle Cage, MD

## 2021-01-08 NOTE — Progress Notes
Notified Dr. Macario Carls of SBP 140-160s and was on Cardene gtt upon arrival. No new orders at this time.

## 2021-01-09 MED ADMIN — METHOCARBAMOL 750 MG PO TAB [4972]: 750 mg | ORAL | @ 16:00:00 | NDC 70010077005

## 2021-01-09 MED ADMIN — NICOTINE 7 MG/24 HR TD PT24 [27860]: 1 | TRANSDERMAL | @ 16:00:00 | NDC 60505706100

## 2021-01-09 MED ADMIN — ENOXAPARIN 40 MG/0.4 ML SC SYRG [85052]: 40 mg | SUBCUTANEOUS | @ 03:00:00 | NDC 00781324602

## 2021-01-09 MED ADMIN — METHOCARBAMOL 750 MG PO TAB [4972]: 750 mg | ORAL | @ 03:00:00 | NDC 70010077005

## 2021-01-09 MED ADMIN — HALOPERIDOL LACTATE 5 MG/ML IJ SOLN [3584]: 5 mg | INTRAVENOUS | @ 23:00:00 | Stop: 2021-01-09 | NDC 63323047400

## 2021-01-09 MED ADMIN — POLYETHYLENE GLYCOL 3350 17 GRAM PO PWPK [25424]: 17 g | ORAL | @ 20:00:00 | NDC 00904693186

## 2021-01-09 MED ADMIN — NICOTINE 7 MG/24 HR TD PT24 [27860]: 1 | TRANSDERMAL | NDC 60505706100

## 2021-01-10 ENCOUNTER — Encounter: Admit: 2021-01-10 | Discharge: 2021-01-10 | Payer: MEDICARE

## 2021-01-10 MED ADMIN — GABAPENTIN 300 MG PO CAP [18308]: 900 mg | ORAL | @ 02:00:00 | Stop: 2021-01-13 | NDC 67877022305

## 2021-01-10 MED ADMIN — HALOPERIDOL LACTATE 5 MG/ML IJ SOLN [3584]: 2.5 mg | INTRAVENOUS | @ 22:00:00 | Stop: 2021-01-10 | NDC 67457042600

## 2021-01-10 MED ADMIN — SENNOSIDES 8.6 MG PO TAB [11349]: 1 | ORAL | @ 02:00:00 | NDC 49483008001

## 2021-01-10 MED ADMIN — TRAZODONE 50 MG PO TAB [8085]: 25 mg | ORAL | @ 03:00:00 | NDC 50111056001

## 2021-01-10 MED ADMIN — SENNOSIDES 8.6 MG PO TAB [11349]: 1 | ORAL | @ 15:00:00 | NDC 49483008001

## 2021-01-10 MED ADMIN — HEPARIN, PORCINE (PF) 5,000 UNIT/0.5 ML IJ SYRG [95535]: 5000 [IU] | SUBCUTANEOUS | @ 04:00:00 | NDC 00409131611

## 2021-01-10 MED ADMIN — NICOTINE 7 MG/24 HR TD PT24 [27860]: 1 | TRANSDERMAL | @ 15:00:00 | NDC 60505706100

## 2021-01-10 MED ADMIN — GABAPENTIN 300 MG PO CAP [18308]: 900 mg | ORAL | @ 10:00:00 | Stop: 2021-01-10 | NDC 67877022305

## 2021-01-10 MED ADMIN — POLYETHYLENE GLYCOL 3350 17 GRAM PO PWPK [25424]: 17 g | ORAL | @ 15:00:00 | NDC 00904693186

## 2021-01-10 MED ADMIN — METHOCARBAMOL 750 MG PO TAB [4972]: 750 mg | ORAL | @ 02:00:00 | NDC 70010077005

## 2021-01-10 MED ADMIN — HEPARIN, PORCINE (PF) 5,000 UNIT/0.5 ML IJ SYRG [95535]: 5000 [IU] | SUBCUTANEOUS | @ 12:00:00 | NDC 00409131611

## 2021-01-10 MED ADMIN — METHOCARBAMOL 750 MG PO TAB [4972]: 750 mg | ORAL | @ 15:00:00 | Stop: 2021-01-10 | NDC 70010077005

## 2021-01-11 MED ADMIN — METHOCARBAMOL 750 MG PO TAB [4972]: 750 mg | ORAL | @ 06:00:00 | NDC 70010077005

## 2021-01-11 MED ADMIN — HEPARIN, PORCINE (PF) 5,000 UNIT/0.5 ML IJ SYRG [95535]: 5000 [IU] | SUBCUTANEOUS | @ 11:00:00 | NDC 00409131611

## 2021-01-11 MED ADMIN — POLYETHYLENE GLYCOL 3350 17 GRAM PO PWPK [25424]: 17 g | ORAL | @ 15:00:00 | NDC 00904693186

## 2021-01-11 MED ADMIN — SENNOSIDES 8.6 MG PO TAB [11349]: 1 | ORAL | @ 02:00:00 | NDC 49483008001

## 2021-01-11 MED ADMIN — SENNOSIDES 8.6 MG PO TAB [11349]: 1 | ORAL | @ 15:00:00 | NDC 49483008001

## 2021-01-11 MED ADMIN — HALOPERIDOL LACTATE 5 MG/ML IJ SOLN [3584]: 2.5 mg | INTRAVENOUS | @ 22:00:00 | Stop: 2021-01-11 | NDC 63323047400

## 2021-01-11 MED ADMIN — HEPARIN, PORCINE (PF) 5,000 UNIT/0.5 ML IJ SYRG [95535]: 5000 [IU] | SUBCUTANEOUS | @ 21:00:00 | NDC 00409131611

## 2021-01-11 MED ADMIN — HYDROXYZINE HCL 25 MG PO TAB [3774]: 25 mg | ORAL | @ 23:00:00 | NDC 00904661761

## 2021-01-11 MED ADMIN — TRAZODONE 50 MG PO TAB [8085]: 50 mg | ORAL | @ 02:00:00 | NDC 50111056001

## 2021-01-12 MED ADMIN — HYDROXYZINE HCL 25 MG PO TAB [3774]: 25 mg | ORAL | @ 15:00:00 | Stop: 2021-01-12 | NDC 00904661761

## 2021-01-12 MED ADMIN — SENNOSIDES 8.6 MG PO TAB [11349]: 1 | ORAL | @ 03:00:00 | NDC 49483008001

## 2021-01-12 MED ADMIN — MELATONIN 5 MG PO TAB [168576]: 5 mg | ORAL | NDC 77333052025

## 2021-01-12 MED ADMIN — LACOSAMIDE 100 MG PO TAB [172159]: 100 mg | ORAL | @ 04:00:00 | Stop: 2021-01-15 | NDC 00904724568

## 2021-01-12 MED ADMIN — LACOSAMIDE 100 MG PO TAB [172159]: 100 mg | ORAL | @ 15:00:00 | Stop: 2021-01-15 | NDC 00904724568

## 2021-01-12 MED ADMIN — BISACODYL 10 MG RE SUPP [1080]: 10 mg | RECTAL | @ 16:00:00 | Stop: 2021-01-12 | NDC 00574705012

## 2021-01-12 MED ADMIN — HEPARIN, PORCINE (PF) 5,000 UNIT/0.5 ML IJ SYRG [95535]: 5000 [IU] | SUBCUTANEOUS | @ 13:00:00 | NDC 00409131611

## 2021-01-12 MED ADMIN — SENNOSIDES 8.6 MG PO TAB [11349]: 1 | ORAL | @ 15:00:00 | Stop: 2021-01-13 | NDC 49483008001

## 2021-01-12 MED ADMIN — POLYETHYLENE GLYCOL 3350 17 GRAM PO PWPK [25424]: 17 g | ORAL | @ 15:00:00 | Stop: 2021-01-13 | NDC 00904693186

## 2021-01-12 MED ADMIN — TRAZODONE 100 MG PO TAB [8083]: 100 mg | ORAL | NDC 00904686961

## 2021-01-12 MED ADMIN — NICOTINE 7 MG/24 HR TD PT24 [27860]: 1 | TRANSDERMAL | @ 15:00:00 | NDC 60505706100

## 2021-01-12 MED ADMIN — HEPARIN, PORCINE (PF) 5,000 UNIT/0.5 ML IJ SYRG [95535]: 5000 [IU] | SUBCUTANEOUS | @ 03:00:00 | NDC 00409131611

## 2021-01-12 MED ADMIN — HYDROXYZINE HCL 25 MG PO TAB [3774]: 25 mg | ORAL | @ 03:00:00 | NDC 00904661761

## 2021-01-13 MED ADMIN — HEPARIN, PORCINE (PF) 5,000 UNIT/0.5 ML IJ SYRG [95535]: 5000 [IU] | SUBCUTANEOUS | @ 12:00:00 | NDC 00409131611

## 2021-01-13 MED ADMIN — POLYETHYLENE GLYCOL 3350 17 GRAM PO PWPK [25424]: 17 g | ORAL | @ 16:00:00 | NDC 00904693186

## 2021-01-13 MED ADMIN — ACETAMINOPHEN 500 MG PO TAB [102]: 1000 mg | ORAL | @ 16:00:00 | NDC 00904672080

## 2021-01-13 MED ADMIN — NICOTINE 7 MG/24 HR TD PT24 [27860]: 1 | TRANSDERMAL | @ 16:00:00 | NDC 60505706100

## 2021-01-13 MED ADMIN — SENNOSIDES 8.6 MG PO TAB [11349]: 1 | ORAL | @ 02:00:00 | Stop: 2021-01-13 | NDC 49483008001

## 2021-01-13 MED ADMIN — HEPARIN, PORCINE (PF) 5,000 UNIT/0.5 ML IJ SYRG [95535]: 5000 [IU] | SUBCUTANEOUS | @ 03:00:00 | NDC 00409131611

## 2021-01-13 MED ADMIN — MELATONIN 5 MG PO TAB [168576]: 5 mg | ORAL | @ 02:00:00 | NDC 77333052025

## 2021-01-13 MED ADMIN — HEPARIN, PORCINE (PF) 5,000 UNIT/0.5 ML IJ SYRG [95535]: 5000 [IU] | SUBCUTANEOUS | @ 21:00:00 | NDC 00409131611

## 2021-01-13 MED ADMIN — ACETAMINOPHEN 500 MG PO TAB [102]: 1000 mg | ORAL | @ 02:00:00 | NDC 00904672080

## 2021-01-13 MED ADMIN — SENNOSIDES 8.6 MG PO TAB [11349]: 2 | ORAL | @ 16:00:00 | NDC 49483008001

## 2021-01-13 MED ADMIN — LACOSAMIDE 100 MG PO TAB [172159]: 100 mg | ORAL | @ 16:00:00 | Stop: 2021-01-15 | NDC 00904724568

## 2021-01-13 MED ADMIN — QUETIAPINE 25 MG PO TAB [76945]: 25 mg | ORAL | @ 03:00:00 | NDC 00904663861

## 2021-01-13 MED ADMIN — TRAZODONE 100 MG PO TAB [8083]: 100 mg | ORAL | @ 02:00:00 | NDC 60687045411

## 2021-01-13 MED ADMIN — LACOSAMIDE 100 MG PO TAB [172159]: 100 mg | ORAL | @ 02:00:00 | Stop: 2021-01-15 | NDC 00904724568

## 2021-01-13 MED ADMIN — ACETAMINOPHEN 500 MG PO TAB [102]: 1000 mg | ORAL | @ 21:00:00 | NDC 00904672080

## 2021-01-14 MED ADMIN — NICOTINE 7 MG/24 HR TD PT24 [27860]: 1 | TRANSDERMAL | @ 15:00:00 | NDC 60505706100

## 2021-01-14 MED ADMIN — LACOSAMIDE 100 MG PO TAB [172159]: 100 mg | ORAL | @ 03:00:00 | Stop: 2021-01-15 | NDC 00904724568

## 2021-01-14 MED ADMIN — LACOSAMIDE 100 MG PO TAB [172159]: 100 mg | ORAL | @ 16:00:00 | Stop: 2021-01-14 | NDC 00904724568

## 2021-01-14 MED ADMIN — ACETAMINOPHEN 500 MG PO TAB [102]: 1000 mg | ORAL | @ 16:00:00 | NDC 00904672080

## 2021-01-14 MED ADMIN — SENNOSIDES 8.6 MG PO TAB [11349]: 2 | ORAL | @ 03:00:00 | NDC 49483008001

## 2021-01-14 MED ADMIN — TRAZODONE 100 MG PO TAB [8083]: 100 mg | ORAL | @ 03:00:00 | NDC 60687045411

## 2021-01-14 MED ADMIN — HEPARIN, PORCINE (PF) 5,000 UNIT/0.5 ML IJ SYRG [95535]: 5000 [IU] | SUBCUTANEOUS | @ 21:00:00 | NDC 00409131611

## 2021-01-14 MED ADMIN — HEPARIN, PORCINE (PF) 5,000 UNIT/0.5 ML IJ SYRG [95535]: 5000 [IU] | SUBCUTANEOUS | @ 11:00:00 | NDC 00409131611

## 2021-01-14 MED ADMIN — MELATONIN 5 MG PO TAB [168576]: 5 mg | ORAL | @ 03:00:00 | NDC 77333052025

## 2021-01-14 MED ADMIN — ACETAMINOPHEN 500 MG PO TAB [102]: 1000 mg | ORAL | @ 21:00:00 | NDC 00904672080

## 2021-01-14 MED ADMIN — ACETAMINOPHEN 500 MG PO TAB [102]: 1000 mg | ORAL | @ 03:00:00 | NDC 00904672080

## 2021-01-14 MED ADMIN — HEPARIN, PORCINE (PF) 5,000 UNIT/0.5 ML IJ SYRG [95535]: 5000 [IU] | SUBCUTANEOUS | @ 04:00:00 | NDC 00409131611

## 2021-01-14 MED ADMIN — POLYETHYLENE GLYCOL 3350 17 GRAM PO PWPK [25424]: 17 g | ORAL | @ 03:00:00 | NDC 00904693186

## 2021-01-15 ENCOUNTER — Encounter: Admit: 2021-01-15 | Discharge: 2021-01-15 | Payer: MEDICARE

## 2021-01-15 MED ADMIN — ACETAMINOPHEN 500 MG PO TAB [102]: 1000 mg | ORAL | @ 15:00:00 | NDC 00904672080

## 2021-01-15 MED ADMIN — TRAZODONE 100 MG PO TAB [8083]: 100 mg | ORAL | @ 03:00:00 | NDC 60687045411

## 2021-01-15 MED ADMIN — ACETAMINOPHEN 500 MG PO TAB [102]: 1000 mg | ORAL | @ 03:00:00 | NDC 00904672080

## 2021-01-15 MED ADMIN — ACETAMINOPHEN 500 MG PO TAB [102]: 1000 mg | ORAL | @ 20:00:00 | NDC 00904672080

## 2021-01-15 MED ADMIN — HEPARIN, PORCINE (PF) 5,000 UNIT/0.5 ML IJ SYRG [95535]: 5000 [IU] | SUBCUTANEOUS | @ 20:00:00 | NDC 00409131611

## 2021-01-15 MED ADMIN — HEPARIN, PORCINE (PF) 5,000 UNIT/0.5 ML IJ SYRG [95535]: 5000 [IU] | SUBCUTANEOUS | @ 13:00:00 | NDC 00409131611

## 2021-01-15 MED ADMIN — NICOTINE 7 MG/24 HR TD PT24 [27860]: 1 | TRANSDERMAL | @ 15:00:00 | NDC 60505706100

## 2021-01-15 MED ADMIN — MELATONIN 5 MG PO TAB [168576]: 5 mg | ORAL | @ 03:00:00 | NDC 77333052025

## 2021-01-16 ENCOUNTER — Encounter: Admit: 2021-01-16 | Discharge: 2021-01-16 | Payer: MEDICARE

## 2021-01-16 MED ADMIN — LOSARTAN 25 MG PO TAB [80886]: 25 mg | ORAL | @ 15:00:00 | Stop: 2021-01-16 | NDC 68084034611

## 2021-01-16 MED ADMIN — HEPARIN, PORCINE (PF) 5,000 UNIT/0.5 ML IJ SYRG [95535]: 5000 [IU] | SUBCUTANEOUS | @ 12:00:00 | Stop: 2021-01-16 | NDC 00409131611

## 2021-01-16 MED ADMIN — ACETAMINOPHEN 500 MG PO TAB [102]: 1000 mg | ORAL | @ 02:00:00 | NDC 00904672080

## 2021-01-16 MED ADMIN — ACETAMINOPHEN 500 MG PO TAB [102]: 1000 mg | ORAL | @ 15:00:00 | Stop: 2021-01-16 | NDC 00904672080

## 2021-01-16 MED ADMIN — TRAZODONE 100 MG PO TAB [8083]: 100 mg | ORAL | @ 02:00:00 | NDC 00904686961

## 2021-01-16 MED ADMIN — HEPARIN, PORCINE (PF) 5,000 UNIT/0.5 ML IJ SYRG [95535]: 5000 [IU] | SUBCUTANEOUS | @ 03:00:00 | NDC 00409131611

## 2021-01-16 MED ADMIN — MELATONIN 5 MG PO TAB [168576]: 5 mg | ORAL | @ 02:00:00 | NDC 77333052025

## 2021-01-16 MED ADMIN — NICOTINE 7 MG/24 HR TD PT24 [27860]: 1 | TRANSDERMAL | @ 15:00:00 | Stop: 2021-01-16 | NDC 60505706100

## 2021-01-17 ENCOUNTER — Encounter: Admit: 2021-01-17 | Discharge: 2021-01-17 | Payer: MEDICARE

## 2021-01-25 ENCOUNTER — Encounter: Admit: 2021-01-25 | Discharge: 2021-01-25 | Payer: MEDICARE

## 2021-01-25 NOTE — Telephone Encounter
I received VM from Dr. Mosetta Anis, physician caring for Tyler Ashley at his rehab facility.  He states that Tyler Ashley is having a hard time being compliant with NWB status and is wanting to know if there is anyway Dr. Weston Settle would be ok with changing him to WB.  I have discussed POC with Dr. Weston Settle and returned Dr. Milus Mallick call and let him know that Tyler Ashley needs to continue his NWB status.   All questions are answered and he verbalized understanding. he has no other concerns at this time.

## 2021-01-27 ENCOUNTER — Encounter: Admit: 2021-01-27 | Discharge: 2021-01-27 | Payer: MEDICARE

## 2021-01-30 ENCOUNTER — Encounter: Admit: 2021-01-30 | Discharge: 2021-01-30 | Payer: MEDICARE

## 2021-01-30 DIAGNOSIS — S82892A Other fracture of left lower leg, initial encounter for closed fracture: Secondary | ICD-10-CM

## 2021-02-01 ENCOUNTER — Ambulatory Visit: Admit: 2021-02-01 | Discharge: 2021-02-01 | Payer: MEDICARE

## 2021-02-01 ENCOUNTER — Encounter: Admit: 2021-02-01 | Discharge: 2021-02-01 | Payer: MEDICARE

## 2021-02-01 DIAGNOSIS — R4189 Other symptoms and signs involving cognitive functions and awareness: Secondary | ICD-10-CM

## 2021-02-01 DIAGNOSIS — G629 Polyneuropathy, unspecified: Secondary | ICD-10-CM

## 2021-02-01 DIAGNOSIS — IMO0002 Squamous cell carcinoma: Secondary | ICD-10-CM

## 2021-02-01 DIAGNOSIS — S82892A Other fracture of left lower leg, initial encounter for closed fracture: Secondary | ICD-10-CM

## 2021-02-01 DIAGNOSIS — I10 Essential (primary) hypertension: Secondary | ICD-10-CM

## 2021-02-01 NOTE — Patient Instructions
Please do not hesitate to contact my office with any questions.    Dr. Blinda Leatherwood  - Orthopedic Surgeon, Trauma  [facility] [place] [facility] - Phone [phone] - Fax [phone]   [address] [place], [place] [address]      Maryellen Pile, RN, BSN - Ambulatory Clinic RN Care Coordinator  - [facility]  Phone [phone] - Fax [phone] - [email or web address]  [address] [place], [place] [address]    L [facility]

## 2021-02-08 ENCOUNTER — Encounter: Admit: 2021-02-08 | Discharge: 2021-02-08 | Payer: MEDICARE

## 2021-03-02 ENCOUNTER — Encounter: Admit: 2021-03-02 | Discharge: 2021-03-02 | Payer: MEDICARE

## 2021-03-02 DIAGNOSIS — S82892A Other fracture of left lower leg, initial encounter for closed fracture: Secondary | ICD-10-CM

## 2021-03-03 ENCOUNTER — Encounter: Admit: 2021-03-03 | Discharge: 2021-03-03 | Payer: MEDICARE

## 2021-03-03 ENCOUNTER — Ambulatory Visit: Admit: 2021-03-03 | Discharge: 2021-03-03 | Payer: MEDICARE

## 2021-03-03 DIAGNOSIS — G629 Polyneuropathy, unspecified: Secondary | ICD-10-CM

## 2021-03-03 DIAGNOSIS — S82892A Other fracture of left lower leg, initial encounter for closed fracture: Secondary | ICD-10-CM

## 2021-03-03 DIAGNOSIS — I10 Essential (primary) hypertension: Secondary | ICD-10-CM

## 2021-03-03 DIAGNOSIS — R4189 Other symptoms and signs involving cognitive functions and awareness: Secondary | ICD-10-CM

## 2021-03-03 DIAGNOSIS — IMO0002 Squamous cell carcinoma: Secondary | ICD-10-CM

## 2021-03-27 ENCOUNTER — Encounter: Admit: 2021-03-27 | Discharge: 2021-03-27 | Payer: MEDICARE

## 2021-03-27 DIAGNOSIS — S82892A Other fracture of left lower leg, initial encounter for closed fracture: Secondary | ICD-10-CM

## 2021-03-31 ENCOUNTER — Ambulatory Visit: Admit: 2021-03-31 | Discharge: 2021-03-31 | Payer: MEDICARE

## 2021-03-31 ENCOUNTER — Encounter: Admit: 2021-03-31 | Discharge: 2021-03-31 | Payer: MEDICARE

## 2021-03-31 DIAGNOSIS — S065XAA Subdural hematoma: Secondary | ICD-10-CM

## 2021-03-31 DIAGNOSIS — G629 Polyneuropathy, unspecified: Secondary | ICD-10-CM

## 2021-03-31 DIAGNOSIS — R4189 Other symptoms and signs involving cognitive functions and awareness: Secondary | ICD-10-CM

## 2021-03-31 DIAGNOSIS — I10 Essential (primary) hypertension: Secondary | ICD-10-CM

## 2021-03-31 DIAGNOSIS — S82892A Other fracture of left lower leg, initial encounter for closed fracture: Secondary | ICD-10-CM

## 2021-03-31 DIAGNOSIS — IMO0002 Squamous cell carcinoma: Secondary | ICD-10-CM

## 2021-04-06 ENCOUNTER — Ambulatory Visit: Admit: 2021-04-06 | Discharge: 2021-04-06 | Payer: MEDICARE

## 2021-04-06 ENCOUNTER — Encounter: Admit: 2021-04-06 | Discharge: 2021-04-06 | Payer: MEDICARE

## 2021-04-06 DIAGNOSIS — R4189 Other symptoms and signs involving cognitive functions and awareness: Secondary | ICD-10-CM

## 2021-04-06 DIAGNOSIS — R42 Dizziness and giddiness: Secondary | ICD-10-CM

## 2021-04-06 DIAGNOSIS — R2689 Other abnormalities of gait and mobility: Secondary | ICD-10-CM

## 2021-04-06 DIAGNOSIS — IMO0002 Squamous cell carcinoma: Secondary | ICD-10-CM

## 2021-04-06 DIAGNOSIS — S069X9S Unspecified intracranial injury with loss of consciousness of unspecified duration, sequela: Secondary | ICD-10-CM

## 2021-04-06 DIAGNOSIS — S065XAA Subdural hematoma: Secondary | ICD-10-CM

## 2021-04-06 DIAGNOSIS — I10 Essential (primary) hypertension: Secondary | ICD-10-CM

## 2021-04-06 DIAGNOSIS — F4321 Adjustment disorder with depressed mood: Secondary | ICD-10-CM

## 2021-04-06 DIAGNOSIS — I609 Nontraumatic subarachnoid hemorrhage, unspecified: Secondary | ICD-10-CM

## 2021-04-06 DIAGNOSIS — G629 Polyneuropathy, unspecified: Secondary | ICD-10-CM

## 2021-04-10 ENCOUNTER — Encounter: Admit: 2021-04-10 | Discharge: 2021-04-10 | Payer: MEDICARE

## 2021-04-10 NOTE — Telephone Encounter
-----   Message from Maryla Morrow, MD sent at 04/10/2021 11:23 AM CST -----  I most definitely need to see him sometime in the next month or two after his TBI with intraparenchymal hemorrhage, subdural hematoma and sub-arachnoid hemorrhage. After that I am glad to hear that he is doing so well, I was concerned that he was heading for a LTCF.    Richard  ----- Message -----  From: Su Monks, MD  Sent: 04/10/2021  10:30 AM CST  To: Maryla Morrow, MD    Hi Dr. Hetty Ely,     You saw this patient in Oct 2022 at which time your note represents no concern for parkinsonism.  I am seeing him now s/p fall in November 2022 with severe traumatic brain injury.  He has gait impairment with some parkinsonism features (primarily with gait, hx of retropulsive falls and some orthostasis as well).  Family with report of significant fall history for several years.  Do you think he would benefit from a re-eval with you?    Thanks for your input,     Karie Mainland Arickx

## 2021-04-10 NOTE — Telephone Encounter
Patient's son called by this RN and appointment made for 04/19/21 @ 1245 for follow-up after TBI.

## 2021-04-11 ENCOUNTER — Encounter: Admit: 2021-04-11 | Discharge: 2021-04-11 | Payer: MEDICARE

## 2021-04-19 ENCOUNTER — Encounter: Admit: 2021-04-19 | Discharge: 2021-04-19 | Payer: MEDICARE

## 2021-04-19 ENCOUNTER — Ambulatory Visit: Admit: 2021-04-19 | Discharge: 2021-04-20 | Payer: MEDICARE

## 2021-04-19 DIAGNOSIS — IMO0002 Squamous cell carcinoma: Secondary | ICD-10-CM

## 2021-04-19 DIAGNOSIS — I609 Nontraumatic subarachnoid hemorrhage, unspecified: Secondary | ICD-10-CM

## 2021-04-19 DIAGNOSIS — G629 Polyneuropathy, unspecified: Secondary | ICD-10-CM

## 2021-04-19 DIAGNOSIS — I10 Essential (primary) hypertension: Secondary | ICD-10-CM

## 2021-04-19 DIAGNOSIS — R4189 Other symptoms and signs involving cognitive functions and awareness: Secondary | ICD-10-CM

## 2021-04-19 NOTE — Patient Instructions
Please contact us through MyChart with any questions or concerns that you may have.

## 2021-04-19 NOTE — Assessment & Plan Note
On follow up today his is slower moving and unsteady compared to his initial visit with me. He did not have resting tremor and his exam is consistent with sequelae of his recent head injury. I am hesitant to start treatment for PD at this time and want him to have more time to recover and to try to get his strength and muscle mass back.     Patient Instructions   Please contact us through MyChart with any questions or concerns that you may have.      Return in about 3 months (around 07/17/2021) for In-Person.

## 2021-04-19 NOTE — Progress Notes
Telehealth Visit Note    Date of Service: 04/19/2021    Subjective:           Tyler Stucki Md is a 88 y.o. male.    History of Present Illness    He was seen today at the request of his PCP for consideration of PD or parkinsonism. Shortly after he was seen by me last fall for a question of PSP (he had normal EOMs) he fell down the stairs suffering a SAH, SDH and IPH. He was hospitalized at [facility] after being transferred to [place] and then was discharged to a rehab facility. He is not living at home and receiving therapy here at [facility] and at home.     He has not had any further falls and uses a walker, for the most part. He does not complain of tremor and has noted some slowness of movement and sitffness.     Review of Systems   Constitutional: Positive for activity change.   Gastrointestinal: Positive for vomiting.   Musculoskeletal: Positive for gait problem.   Neurological: Positive for dizziness.   Psychiatric/Behavioral: Positive for decreased concentration. The patient is nervous/anxious.    All other systems reviewed and are negative.    Medications:  ? acetaminophen (TYLENOL EXTRA STRENGTH) 500 mg tablet Take two tablets by mouth every 6 hours as needed. Max of 4,000 mg of acetaminophen in 24 hours.   ? amLODIPine (NORVASC) 10 mg tablet Take one tablet by mouth daily.     ? calcium carbonate (OS-CAL) 1250 mg tablet Take one tablet by mouth daily.     ? cholecalciferol (vitamin D3) (VITAMIN D3) 1,000 units tablet Take one tablet by mouth daily.     ? folic acid (FOLVITE) 1 mg tablet Take one tablet by mouth daily.   ? losartan (COZAAR) 25 mg tablet Take one tablet by mouth daily.   ? melatonin 5 mg tablet Take one tablet by mouth at bedtime daily.not taking   ? naloxone (NARCAN) 4 mg/actuation nasal spray Insert 1 spray into 1 nostril as needed for signs of opioid overdose then call 911. May repeat dose every 2-3 minutes (alternate nostrils) until medical team arrives.   ? polyethylene glycol 3350 (MIRALAX) 17 g packet Take one packet by mouth twice daily. While taking narcotics   ? QUEtiapine (SEROQUEL) 25 mg tablet Take one tablet by mouth every 6 hours as needed (while awake).not taking   ? senna (SENOKOT) 8.6 mg tablet Take two tablets by mouth twice daily.not taking   ? spironolactone (ALDACTONE) 25 mg tablet Take one tablet by mouth daily.not taking   ? tamsulosin (FLOMAX) 0.4 mg capsule Take one capsule by mouth daily.for prostate   ? traZODone (DESYREL) 100 mg tablet Take one tablet by mouth at bedtime daily.taking for sleep   ? vit C/E/Zn/coppr/lutein/zeaxan (PRESERVISION AREDS-2 PO) Take 1 tablet by mouth daily.       .  Objective:              Telehealth Patient Reported Vitals     Row Name 04/19/21 1254                Pain Score Zero                  Computed Telehealth Body Mass Index unavailable. Necessary lab results were not found in the last year.    Physical Exam    Well developed gentleman seated in his home with his son assisting and manning  the camera. His wife was off camera.    His neurological examination was limited    Neurological examination:  Mental Status Examiantion: He is alert and cooperative. He arrived at the proper location on time and was able to independently present his history and current complaints. His language functions are normal as are cognitive and memory functions.    Cranial Nerve Examination: He appeared to have normal visual fields, yet he moved his head and his eyes on command. He had normal facial symmetry and perhaps some slight hypomimea     Coordination: He performed normally on for his age for rapid alternating movements. He has normal spontaneous movements and no evidence of tremor with action. His stance was slightly stooped. His gait was slow, cautious and he relied upon the table or his walker for support. He did not appear to be shuffling    I personally reviewed the CT brain from 03/31/21 and compared it to his 01/25/21 CT scan. There has been resolution of the SDH, SAH and IPH. There is new encephalomalcia of his right inferior frontal lobe and ventriculomegally, similar to his pre0injury scans.      Assessment and Plan:    Problem   Sah (Subarachnoid Hemorrhage) (Hcc)    He suffered a SAH, SDH and IPH from a fall on the stairs 01/07/21 and was hospitalized for three months         SAH (subarachnoid hemorrhage) (HCC)  On follow up today his is slower moving and unsteady compared to his initial visit with me. He did not have resting tremor and his exam is consistent with sequelae of his recent head injury. I am hesitant to start treatment for PD at this time and want him to have more time to recover and to try to get his strength and muscle mass back.     Patient Instructions   Please contact us through MyChart with any questions or concerns that you may have.      Return in about 3 months (around 07/17/2021) for In-Person.                 Total Time Today was 25 minutes in the following activities: Preparing to see the patient, Obtaining and/or reviewing separately obtained history, Performing a medically appropriate examination and/or evaluation, Counseling and educating the patient/family/caregiver and Documenting clinical information in the electronic or other health record

## 2021-04-28 ENCOUNTER — Encounter: Admit: 2021-04-28 | Discharge: 2021-04-28 | Payer: MEDICARE

## 2021-05-02 ENCOUNTER — Encounter: Admit: 2021-05-02 | Discharge: 2021-05-02 | Payer: MEDICARE

## 2021-05-09 ENCOUNTER — Encounter: Admit: 2021-05-09 | Discharge: 2021-05-09 | Payer: MEDICARE

## 2021-05-16 ENCOUNTER — Encounter: Admit: 2021-05-16 | Discharge: 2021-05-16 | Payer: MEDICARE

## 2021-05-30 ENCOUNTER — Encounter: Admit: 2021-05-30 | Discharge: 2021-05-30 | Payer: MEDICARE

## 2021-06-06 ENCOUNTER — Encounter
Admit: 2021-06-06 | Discharge: 2021-06-06 | Payer: MEDICARE | Primary: Student in an Organized Health Care Education/Training Program

## 2021-06-26 ENCOUNTER — Ambulatory Visit
Admit: 2021-06-26 | Discharge: 2021-06-27 | Payer: MEDICARE | Primary: Student in an Organized Health Care Education/Training Program

## 2021-06-26 ENCOUNTER — Encounter
Admit: 2021-06-26 | Discharge: 2021-06-26 | Payer: MEDICARE | Primary: Student in an Organized Health Care Education/Training Program

## 2021-07-07 ENCOUNTER — Encounter
Admit: 2021-07-07 | Discharge: 2021-07-07 | Payer: MEDICARE | Primary: Student in an Organized Health Care Education/Training Program

## 2021-07-07 ENCOUNTER — Ambulatory Visit
Admit: 2021-07-07 | Discharge: 2021-07-07 | Payer: MEDICARE | Primary: Student in an Organized Health Care Education/Training Program

## 2021-07-07 DIAGNOSIS — S069X9S Unspecified intracranial injury with loss of consciousness of unspecified duration, sequela: Secondary | ICD-10-CM

## 2021-07-07 DIAGNOSIS — R4189 Other symptoms and signs involving cognitive functions and awareness: Secondary | ICD-10-CM

## 2021-07-07 DIAGNOSIS — I10 Essential (primary) hypertension: Secondary | ICD-10-CM

## 2021-07-07 DIAGNOSIS — IMO0002 Squamous cell carcinoma: Secondary | ICD-10-CM

## 2021-07-07 DIAGNOSIS — G629 Polyneuropathy, unspecified: Secondary | ICD-10-CM

## 2021-07-14 ENCOUNTER — Encounter
Admit: 2021-07-14 | Discharge: 2021-07-14 | Payer: MEDICARE | Primary: Student in an Organized Health Care Education/Training Program

## 2021-07-17 ENCOUNTER — Encounter
Admit: 2021-07-17 | Discharge: 2021-07-17 | Payer: MEDICARE | Primary: Student in an Organized Health Care Education/Training Program

## 2021-07-17 DIAGNOSIS — R42 Dizziness and giddiness: Secondary | ICD-10-CM

## 2021-07-20 ENCOUNTER — Encounter
Admit: 2021-07-20 | Discharge: 2021-07-20 | Payer: MEDICARE | Primary: Student in an Organized Health Care Education/Training Program

## 2021-07-28 ENCOUNTER — Encounter
Admit: 2021-07-28 | Discharge: 2021-07-28 | Payer: MEDICARE | Primary: Student in an Organized Health Care Education/Training Program

## 2021-08-04 ENCOUNTER — Encounter
Admit: 2021-08-04 | Discharge: 2021-08-04 | Payer: MEDICARE | Primary: Student in an Organized Health Care Education/Training Program

## 2021-08-11 ENCOUNTER — Encounter
Admit: 2021-08-11 | Discharge: 2021-08-11 | Payer: MEDICARE | Primary: Student in an Organized Health Care Education/Training Program

## 2021-08-24 ENCOUNTER — Encounter
Admit: 2021-08-24 | Discharge: 2021-08-24 | Payer: MEDICARE | Primary: Student in an Organized Health Care Education/Training Program

## 2021-09-04 ENCOUNTER — Encounter
Admit: 2021-09-04 | Discharge: 2021-09-04 | Payer: MEDICARE | Primary: Student in an Organized Health Care Education/Training Program

## 2021-09-08 ENCOUNTER — Encounter
Admit: 2021-09-08 | Discharge: 2021-09-08 | Payer: MEDICARE | Primary: Student in an Organized Health Care Education/Training Program

## 2021-10-03 ENCOUNTER — Ambulatory Visit
Admit: 2021-10-03 | Discharge: 2021-10-03 | Payer: MEDICARE | Primary: Student in an Organized Health Care Education/Training Program

## 2021-10-03 ENCOUNTER — Encounter
Admit: 2021-10-03 | Discharge: 2021-10-03 | Payer: MEDICARE | Primary: Student in an Organized Health Care Education/Training Program

## 2021-10-03 DIAGNOSIS — R42 Dizziness and giddiness: Secondary | ICD-10-CM

## 2021-10-03 DIAGNOSIS — IMO0002 Squamous cell carcinoma: Secondary | ICD-10-CM

## 2021-10-03 DIAGNOSIS — I10 Essential (primary) hypertension: Secondary | ICD-10-CM

## 2021-10-03 DIAGNOSIS — S069XAS Traumatic brain injury, with unknown loss of consciousness status, sequela (HCC): Secondary | ICD-10-CM

## 2021-10-03 DIAGNOSIS — R2689 Other abnormalities of gait and mobility: Secondary | ICD-10-CM

## 2021-10-03 DIAGNOSIS — G629 Polyneuropathy, unspecified: Secondary | ICD-10-CM

## 2021-10-03 DIAGNOSIS — H9190 Unspecified hearing loss, unspecified ear: Secondary | ICD-10-CM

## 2021-10-03 DIAGNOSIS — R4189 Other symptoms and signs involving cognitive functions and awareness: Secondary | ICD-10-CM

## 2021-10-03 NOTE — Progress Notes
Date of Service: 10/03/2021    Subjective:             Tyler Ashley is a 88 y.o. male.    History of Present Illness         The patient has a history of traumatic brain injury and has been experiencing dizziness. 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  The dizziness is described as a lightheaded sensation that occurs upon waking up in the morning and lasts for about five minutes. It is triggered by rolling over onto his right side and by sitting up.  The patient feels unstable during these episodes but does not experience any changes in hearing or buzzing in the ears. The dizziness is always associated with movement. The patient has not had these symptoms prior to the brain injury. The patient also has a history of neuropathy in their feet, which may have contributed to unsteadiness before the fall. The patient's ear is full of wax, but it is not causing any discomfort or affecting their hearing and he declines removal today. The patient has difficulty swallowing and has been experiencing increased throat clearing since being discharged from the hospital in the spring. The patient has tried various treatments for their balance issues, including therapy and using a cane, but has not found significant improvement.       Vestibular rehab notes reviewed.  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  Concern for central vertigo.  Overall dizziness improvement with therapy.       Review of Systems   Constitutional: Negative.    HENT: Negative.    Eyes: Negative.    Respiratory: Negative.    Cardiovascular: Negative.    Gastrointestinal: Negative.    Endocrine: Negative.    Genitourinary: Negative.    Musculoskeletal: Negative.    Skin: Negative.    Allergic/Immunologic: Negative.    Neurological: Positive for dizziness.   Hematological: Negative.    Psychiatric/Behavioral: Negative. Objective:         ? amLODIPine (NORVASC) 10 mg tablet Take one tablet by mouth daily.     ? calcium carbonate (OS-CAL) 1250 mg tablet Take one tablet by mouth daily.     ? cholecalciferol (vitamin D3) (VITAMIN D3) 1,000 units tablet Take five tablets by mouth daily.     ? folic acid (FOLVITE) 1 mg tablet Take one tablet by mouth daily.   ? losartan (COZAAR) 25 mg tablet Take one tablet by mouth daily.   ? spironolactone (ALDACTONE) 25 mg tablet Take one tablet by mouth daily.   ? tamsulosin (FLOMAX) 0.4 mg capsule Take one capsule by mouth daily.   ? traZODone (DESYREL) 100 mg tablet Take one tablet by mouth at bedtime daily.   ? vit C/E/Zn/coppr/lutein/zeaxan (PRESERVISION AREDS-2 PO) Take 1 tablet by mouth daily.     Vitals:    10/03/21 1412   BP: (!) 143/95   Pulse: 109   PainSc: Zero   Weight: 72.6 kg (160 lb)   Height: 177.8 cm (5' 10)     Body mass index is 22.96 kg/m?Marland Kitchen     Physical Exam    General   Well-developed, well-nourished   Communication and Voice:  Clear pitch and clarity, age appropriate   Head and Face   Inspection:  Normocephalic and atraumatic without masses or lesions  Eyes   Nystagmus: None   EOM: Equal extraocular motion bilaterally  ENT   External nose:  No scar or anatomic deformity   Internal Nose:  Septum intact and midline.  No edema, polyps, or rhinorrhea   Lips, Teeth, and gums:  Mucosa and teeth intact and viable   Oral cavity/oropharynx:  No erythema or exudate, no nodules, lesions or masses noted  Ear   External canal:  Left - Canal is patent with intact skin                             Right - Canal is patent with intact skin   Tympanic Membranes:  Left - Clear and mobile                                             Right - Clear and mobile   Middle Ears:      Left - appears aerated, no effusion, no masses                           Right - appears aerated, no effusion, no masses  Respiratory   Respiratory effort:  Equal inspiration & expiration without use of accessory muscles. No  stridor  Neuro/Psych/Balance   Orientation: Patient oriented to person, place, and time   Affect: Appropriate mood and affect       Assessment and Plan:       Dizziness: Patient experiences lightheadedness upon waking up and with certain movements, lasting for about five minutes. The plan is to conduct formal balance testing to assess the different aspects of the balance system and determine if the issue is central (brain function related) or peripheral (inner ear related). This will help guide further treatment and therapy.    Swallowing difficulties: Patient has difficulty swallowing and clearing throat. The plan is to continue monitoring the situation and work with a speech therapist if necessary. The focus will be on finding the right consistencies and types of food that the patient can tolerate, as well as using small sips of fluids to help with swallowing.    Earwax buildup: Patient has a buildup of earwax in one ear. The plan is to clean the ear if it is causing discomfort or affecting hearing. Otherwise, no immediate action is needed.                           Total Time Today was 45 minutes in the following activities: Preparing to see the patient, Obtaining and/or reviewing separately obtained history, Performing a medically appropriate examination and/or evaluation, Counseling and educating the patient/family/caregiver, Ordering medications, tests, or procedures and Documenting clinical information in the electronic or other health record

## 2021-10-16 ENCOUNTER — Encounter
Admit: 2021-10-16 | Discharge: 2021-10-16 | Payer: MEDICARE | Primary: Student in an Organized Health Care Education/Training Program

## 2021-10-16 ENCOUNTER — Ambulatory Visit
Admit: 2021-10-16 | Discharge: 2021-10-16 | Payer: MEDICARE | Primary: Student in an Organized Health Care Education/Training Program

## 2021-10-27 ENCOUNTER — Encounter
Admit: 2021-10-27 | Discharge: 2021-10-27 | Payer: MEDICARE | Primary: Student in an Organized Health Care Education/Training Program

## 2021-11-15 ENCOUNTER — Encounter
Admit: 2021-11-15 | Discharge: 2021-11-15 | Payer: MEDICARE | Primary: Student in an Organized Health Care Education/Training Program

## 2021-12-05 ENCOUNTER — Encounter
Admit: 2021-12-05 | Discharge: 2021-12-05 | Payer: MEDICARE | Primary: Student in an Organized Health Care Education/Training Program

## 2021-12-05 ENCOUNTER — Ambulatory Visit
Admit: 2021-12-05 | Discharge: 2021-12-06 | Payer: MEDICARE | Primary: Student in an Organized Health Care Education/Training Program

## 2021-12-05 DIAGNOSIS — G629 Polyneuropathy, unspecified: Secondary | ICD-10-CM

## 2021-12-05 DIAGNOSIS — I609 Nontraumatic subarachnoid hemorrhage, unspecified: Secondary | ICD-10-CM

## 2021-12-05 DIAGNOSIS — R4189 Other symptoms and signs involving cognitive functions and awareness: Secondary | ICD-10-CM

## 2021-12-05 DIAGNOSIS — I10 Essential (primary) hypertension: Secondary | ICD-10-CM

## 2021-12-05 DIAGNOSIS — IMO0002 Squamous cell carcinoma: Secondary | ICD-10-CM

## 2021-12-05 NOTE — Progress Notes
Date of Service: 12/05/2021    Subjective:             Tyler Forslund Md is a 88 y.o. male.    History of Present Illness    He is brought in today by his son for follow up. I last saw him in the Spring by [facility] as he was recovering at home after his [facility] and IPH after a fall in November 2022. He has finished rehab and is living at home. He has residual cognitive problems and no current behavioral problems.    Review of Systems   Constitutional: Positive for fatigue.   HENT: Positive for trouble swallowing.    Eyes: Negative.    Respiratory: Negative.    Cardiovascular: Negative.    Gastrointestinal: Negative.    Endocrine: Negative.    Genitourinary: Negative.    Musculoskeletal: Positive for back pain and gait problem.   Skin: Negative.    Allergic/Immunologic: Negative.    Hematological: Negative.    Psychiatric/Behavioral: Positive for decreased concentration and dysphoric mood. The patient is nervous/anxious.          Medications:         ? amLODIPine (NORVASC) 10 mg tablet Take one tablet by mouth daily.     ? calcium carbonate (OS-CAL) 1250 mg tablet Take one tablet by mouth daily.     ? cholecalciferol (vitamin D3) (VITAMIN D3) 1,000 units tablet Take five tablets by mouth daily.     ? folic acid (FOLVITE) 1 mg tablet Take one tablet by mouth daily.   ? losartan (COZAAR) 25 mg tablet Take one tablet by mouth daily.   ? metoprolol succinate XL (TOPROL XL) 50 mg extended release tablet Take one tablet by mouth daily.   ? sacubitriL-valsartan (ENTRESTO) 24-26 mg tablet Take one tablet by mouth twice daily.   ? tamsulosin (FLOMAX) 0.4 mg capsule Take one capsule by mouth daily.   ? traZODone (DESYREL) 100 mg tablet Take one tablet by mouth at bedtime daily.   ? vit C/E/Zn/coppr/lutein/zeaxan (PRESERVISION AREDS-2 PO) Take 1 tablet by mouth daily.       Objective:  Vitals:    12/05/21 1603   BP: 136/88   Pulse: 102   Temp: 37 ?C (98.6 ?F)   PainSc: One   Weight: 72.6 kg (160 lb)   Height: 177.8 cm (5' 10)     Body mass index is 22.96 kg/m?Marland Kitchen     Physical Exam    He is seated in the exam room with his son. He is wearing a skate boarding/bicycling helmut, by his own choice.    Neurological examination:  Mental Status Examiantion: He is alert and cooperative. He arrived at the proper location on time Most of the history was provided by his son.     Cranial Nerve Examination: Normal visual fields, normal EOMs, muscles of facial expression,, and neck rotation were all normal     Motor: He has normal muscle tone and bulk.     Coordination: He performed normally on rapid alternating movements. He has normal spontaneous movements and no evidence of tremor. His stood using his walking. He tended to shuffle when walking with his walker and made a very neat three step turn. His gait was cautious.         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     He has no clinical evidence of PSP or of parkinsonism which was the reason for his original referral to see me.    In looking at his records, he apparently consumes quite a bit of ethanol and this may be contributing to his overall decline.    Patient Instructions   Please contact us through MyChart with any questions or concerns that you may have.      Return if symptoms worsen or fail to improve.

## 2021-12-05 NOTE — Patient Instructions
Please contact us through MyChart with any questions or concerns that you may have.

## 2021-12-06 ENCOUNTER — Ambulatory Visit
Admit: 2021-12-06 | Discharge: 2021-12-07 | Payer: MEDICARE | Primary: Student in an Organized Health Care Education/Training Program

## 2021-12-06 ENCOUNTER — Encounter
Admit: 2021-12-06 | Discharge: 2021-12-06 | Payer: MEDICARE | Primary: Student in an Organized Health Care Education/Training Program

## 2021-12-06 NOTE — Assessment & Plan Note
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     He has no clinical evidence of PSP or of parkinsonism which was the reason for his original referral to see me.    In looking at his records, he apparently consumes quite a bit of ethanol and this may be contributing to his overall decline.    Patient Instructions   Please contact us through MyChart with any questions or concerns that you may have.      Return if symptoms worsen or fail to improve.

## 2021-12-06 NOTE — Patient Instructions
It was a pleasure seeing you in clinic today.    Rick Blanton Kardell RN, BSN  Clinical Nurse Coordinator  Physical Medicine & Rehab  Dr. Alexandra Arickx  [facility]  [facility]  [address]  [place], [place] [address]    Phone: [phone]  Fax [phone]  For scheduling, cancelling, or changing appointments [phone]    For prescription refills, please contact your pharmacy.  Please allow 2-3 days for refill requests to be filled.      For information on spinal conditions, please visit [email or web address]

## 2021-12-19 ENCOUNTER — Encounter
Admit: 2021-12-19 | Discharge: 2021-12-19 | Payer: MEDICARE | Primary: Student in an Organized Health Care Education/Training Program

## 2022-02-05 ENCOUNTER — Encounter
Admit: 2022-02-05 | Discharge: 2022-02-05 | Payer: MEDICARE | Primary: Student in an Organized Health Care Education/Training Program

## 2022-03-15 ENCOUNTER — Encounter
Admit: 2022-03-15 | Discharge: 2022-03-15 | Payer: MEDICARE | Primary: Student in an Organized Health Care Education/Training Program
